# Patient Record
Sex: Male | Born: 1935 | Race: White | Hispanic: No | Marital: Married | Smoking: Never smoker
Health system: Southern US, Community
[De-identification: ages and names within clinical notes are randomized; demographics above are authoritative.]

## PROBLEM LIST (undated history)

## (undated) DIAGNOSIS — I2510 Atherosclerotic heart disease of native coronary artery without angina pectoris: Secondary | ICD-10-CM

## (undated) DIAGNOSIS — E7800 Pure hypercholesterolemia, unspecified: Secondary | ICD-10-CM

## (undated) DIAGNOSIS — I10 Essential (primary) hypertension: Secondary | ICD-10-CM

## (undated) DIAGNOSIS — I82409 Acute embolism and thrombosis of unspecified deep veins of unspecified lower extremity: Secondary | ICD-10-CM

## (undated) DIAGNOSIS — K219 Gastro-esophageal reflux disease without esophagitis: Secondary | ICD-10-CM

## (undated) HISTORY — PX: OTHER SURGICAL HISTORY: SHX169

## (undated) HISTORY — PX: APPENDECTOMY: SHX54

---

## 1998-04-09 ENCOUNTER — Ambulatory Visit (HOSPITAL_BASED_OUTPATIENT_CLINIC_OR_DEPARTMENT_OTHER): Admission: RE | Admit: 1998-04-09 | Discharge: 1998-04-09 | Payer: Self-pay | Admitting: Urology

## 1998-07-20 ENCOUNTER — Ambulatory Visit (HOSPITAL_COMMUNITY): Admission: RE | Admit: 1998-07-20 | Discharge: 1998-07-20 | Payer: Self-pay | Admitting: Gastroenterology

## 1999-11-21 ENCOUNTER — Ambulatory Visit: Admission: RE | Admit: 1999-11-21 | Discharge: 1999-11-21 | Payer: Self-pay | Admitting: *Deleted

## 1999-11-22 ENCOUNTER — Ambulatory Visit: Admission: RE | Admit: 1999-11-22 | Discharge: 1999-11-22 | Payer: Self-pay | Admitting: *Deleted

## 2000-01-18 ENCOUNTER — Ambulatory Visit (HOSPITAL_COMMUNITY): Admission: RE | Admit: 2000-01-18 | Discharge: 2000-01-18 | Payer: Self-pay | Admitting: Urology

## 2000-01-18 ENCOUNTER — Encounter: Payer: Self-pay | Admitting: Urology

## 2011-10-24 DIAGNOSIS — J309 Allergic rhinitis, unspecified: Secondary | ICD-10-CM | POA: Diagnosis not present

## 2011-10-24 DIAGNOSIS — I739 Peripheral vascular disease, unspecified: Secondary | ICD-10-CM | POA: Diagnosis not present

## 2011-10-24 DIAGNOSIS — L97209 Non-pressure chronic ulcer of unspecified calf with unspecified severity: Secondary | ICD-10-CM | POA: Diagnosis not present

## 2011-10-24 DIAGNOSIS — L97809 Non-pressure chronic ulcer of other part of unspecified lower leg with unspecified severity: Secondary | ICD-10-CM | POA: Diagnosis not present

## 2011-10-24 DIAGNOSIS — L259 Unspecified contact dermatitis, unspecified cause: Secondary | ICD-10-CM | POA: Diagnosis not present

## 2011-10-24 DIAGNOSIS — L918 Other hypertrophic disorders of the skin: Secondary | ICD-10-CM | POA: Diagnosis not present

## 2011-10-24 DIAGNOSIS — I10 Essential (primary) hypertension: Secondary | ICD-10-CM | POA: Diagnosis not present

## 2011-10-24 DIAGNOSIS — I872 Venous insufficiency (chronic) (peripheral): Secondary | ICD-10-CM | POA: Diagnosis not present

## 2011-10-28 DIAGNOSIS — I10 Essential (primary) hypertension: Secondary | ICD-10-CM | POA: Diagnosis not present

## 2011-10-28 DIAGNOSIS — I739 Peripheral vascular disease, unspecified: Secondary | ICD-10-CM | POA: Diagnosis not present

## 2011-10-28 DIAGNOSIS — I872 Venous insufficiency (chronic) (peripheral): Secondary | ICD-10-CM | POA: Diagnosis not present

## 2011-10-28 DIAGNOSIS — L97809 Non-pressure chronic ulcer of other part of unspecified lower leg with unspecified severity: Secondary | ICD-10-CM | POA: Diagnosis not present

## 2011-11-01 DIAGNOSIS — L918 Other hypertrophic disorders of the skin: Secondary | ICD-10-CM | POA: Diagnosis not present

## 2011-11-01 DIAGNOSIS — L97209 Non-pressure chronic ulcer of unspecified calf with unspecified severity: Secondary | ICD-10-CM | POA: Diagnosis not present

## 2011-11-01 DIAGNOSIS — I739 Peripheral vascular disease, unspecified: Secondary | ICD-10-CM | POA: Diagnosis not present

## 2011-11-01 DIAGNOSIS — I872 Venous insufficiency (chronic) (peripheral): Secondary | ICD-10-CM | POA: Diagnosis not present

## 2011-11-01 DIAGNOSIS — I10 Essential (primary) hypertension: Secondary | ICD-10-CM | POA: Diagnosis not present

## 2011-11-01 DIAGNOSIS — L97809 Non-pressure chronic ulcer of other part of unspecified lower leg with unspecified severity: Secondary | ICD-10-CM | POA: Diagnosis not present

## 2011-11-04 DIAGNOSIS — I872 Venous insufficiency (chronic) (peripheral): Secondary | ICD-10-CM | POA: Diagnosis not present

## 2011-11-04 DIAGNOSIS — I10 Essential (primary) hypertension: Secondary | ICD-10-CM | POA: Diagnosis not present

## 2011-11-04 DIAGNOSIS — L97809 Non-pressure chronic ulcer of other part of unspecified lower leg with unspecified severity: Secondary | ICD-10-CM | POA: Diagnosis not present

## 2011-11-04 DIAGNOSIS — I739 Peripheral vascular disease, unspecified: Secondary | ICD-10-CM | POA: Diagnosis not present

## 2011-11-08 DIAGNOSIS — I872 Venous insufficiency (chronic) (peripheral): Secondary | ICD-10-CM | POA: Diagnosis not present

## 2011-11-08 DIAGNOSIS — L97209 Non-pressure chronic ulcer of unspecified calf with unspecified severity: Secondary | ICD-10-CM | POA: Diagnosis not present

## 2011-11-08 DIAGNOSIS — I10 Essential (primary) hypertension: Secondary | ICD-10-CM | POA: Diagnosis not present

## 2011-11-08 DIAGNOSIS — L918 Other hypertrophic disorders of the skin: Secondary | ICD-10-CM | POA: Diagnosis not present

## 2011-11-08 DIAGNOSIS — I739 Peripheral vascular disease, unspecified: Secondary | ICD-10-CM | POA: Diagnosis not present

## 2011-11-08 DIAGNOSIS — L97809 Non-pressure chronic ulcer of other part of unspecified lower leg with unspecified severity: Secondary | ICD-10-CM | POA: Diagnosis not present

## 2011-11-11 DIAGNOSIS — I872 Venous insufficiency (chronic) (peripheral): Secondary | ICD-10-CM | POA: Diagnosis not present

## 2011-11-11 DIAGNOSIS — L97809 Non-pressure chronic ulcer of other part of unspecified lower leg with unspecified severity: Secondary | ICD-10-CM | POA: Diagnosis not present

## 2011-11-11 DIAGNOSIS — I10 Essential (primary) hypertension: Secondary | ICD-10-CM | POA: Diagnosis not present

## 2011-11-11 DIAGNOSIS — I739 Peripheral vascular disease, unspecified: Secondary | ICD-10-CM | POA: Diagnosis not present

## 2011-11-15 DIAGNOSIS — I872 Venous insufficiency (chronic) (peripheral): Secondary | ICD-10-CM | POA: Diagnosis not present

## 2011-11-15 DIAGNOSIS — I739 Peripheral vascular disease, unspecified: Secondary | ICD-10-CM | POA: Diagnosis not present

## 2011-11-15 DIAGNOSIS — L97809 Non-pressure chronic ulcer of other part of unspecified lower leg with unspecified severity: Secondary | ICD-10-CM | POA: Diagnosis not present

## 2011-11-15 DIAGNOSIS — I10 Essential (primary) hypertension: Secondary | ICD-10-CM | POA: Diagnosis not present

## 2011-11-18 DIAGNOSIS — I10 Essential (primary) hypertension: Secondary | ICD-10-CM | POA: Diagnosis not present

## 2011-11-18 DIAGNOSIS — I739 Peripheral vascular disease, unspecified: Secondary | ICD-10-CM | POA: Diagnosis not present

## 2011-11-18 DIAGNOSIS — I872 Venous insufficiency (chronic) (peripheral): Secondary | ICD-10-CM | POA: Diagnosis not present

## 2011-11-18 DIAGNOSIS — L97809 Non-pressure chronic ulcer of other part of unspecified lower leg with unspecified severity: Secondary | ICD-10-CM | POA: Diagnosis not present

## 2011-11-22 DIAGNOSIS — L97209 Non-pressure chronic ulcer of unspecified calf with unspecified severity: Secondary | ICD-10-CM | POA: Diagnosis not present

## 2011-11-22 DIAGNOSIS — L918 Other hypertrophic disorders of the skin: Secondary | ICD-10-CM | POA: Diagnosis not present

## 2011-11-22 DIAGNOSIS — I739 Peripheral vascular disease, unspecified: Secondary | ICD-10-CM | POA: Diagnosis not present

## 2011-11-22 DIAGNOSIS — I872 Venous insufficiency (chronic) (peripheral): Secondary | ICD-10-CM | POA: Diagnosis not present

## 2011-11-22 DIAGNOSIS — I10 Essential (primary) hypertension: Secondary | ICD-10-CM | POA: Diagnosis not present

## 2011-11-22 DIAGNOSIS — L97809 Non-pressure chronic ulcer of other part of unspecified lower leg with unspecified severity: Secondary | ICD-10-CM | POA: Diagnosis not present

## 2011-11-25 DIAGNOSIS — I739 Peripheral vascular disease, unspecified: Secondary | ICD-10-CM | POA: Diagnosis not present

## 2011-11-25 DIAGNOSIS — L97809 Non-pressure chronic ulcer of other part of unspecified lower leg with unspecified severity: Secondary | ICD-10-CM | POA: Diagnosis not present

## 2011-11-25 DIAGNOSIS — I10 Essential (primary) hypertension: Secondary | ICD-10-CM | POA: Diagnosis not present

## 2011-11-25 DIAGNOSIS — I872 Venous insufficiency (chronic) (peripheral): Secondary | ICD-10-CM | POA: Diagnosis not present

## 2011-11-29 DIAGNOSIS — I10 Essential (primary) hypertension: Secondary | ICD-10-CM | POA: Diagnosis not present

## 2011-11-29 DIAGNOSIS — M79609 Pain in unspecified limb: Secondary | ICD-10-CM | POA: Diagnosis not present

## 2011-11-29 DIAGNOSIS — I739 Peripheral vascular disease, unspecified: Secondary | ICD-10-CM | POA: Diagnosis not present

## 2011-11-29 DIAGNOSIS — B351 Tinea unguium: Secondary | ICD-10-CM | POA: Diagnosis not present

## 2011-11-29 DIAGNOSIS — I872 Venous insufficiency (chronic) (peripheral): Secondary | ICD-10-CM | POA: Diagnosis not present

## 2011-11-29 DIAGNOSIS — L97809 Non-pressure chronic ulcer of other part of unspecified lower leg with unspecified severity: Secondary | ICD-10-CM | POA: Diagnosis not present

## 2011-12-02 DIAGNOSIS — L97809 Non-pressure chronic ulcer of other part of unspecified lower leg with unspecified severity: Secondary | ICD-10-CM | POA: Diagnosis not present

## 2011-12-02 DIAGNOSIS — I872 Venous insufficiency (chronic) (peripheral): Secondary | ICD-10-CM | POA: Diagnosis not present

## 2011-12-02 DIAGNOSIS — I10 Essential (primary) hypertension: Secondary | ICD-10-CM | POA: Diagnosis not present

## 2011-12-02 DIAGNOSIS — I739 Peripheral vascular disease, unspecified: Secondary | ICD-10-CM | POA: Diagnosis not present

## 2011-12-06 DIAGNOSIS — I739 Peripheral vascular disease, unspecified: Secondary | ICD-10-CM | POA: Diagnosis not present

## 2011-12-06 DIAGNOSIS — I10 Essential (primary) hypertension: Secondary | ICD-10-CM | POA: Diagnosis not present

## 2011-12-06 DIAGNOSIS — L97809 Non-pressure chronic ulcer of other part of unspecified lower leg with unspecified severity: Secondary | ICD-10-CM | POA: Diagnosis not present

## 2011-12-06 DIAGNOSIS — L97209 Non-pressure chronic ulcer of unspecified calf with unspecified severity: Secondary | ICD-10-CM | POA: Diagnosis not present

## 2011-12-06 DIAGNOSIS — L918 Other hypertrophic disorders of the skin: Secondary | ICD-10-CM | POA: Diagnosis not present

## 2011-12-06 DIAGNOSIS — I872 Venous insufficiency (chronic) (peripheral): Secondary | ICD-10-CM | POA: Diagnosis not present

## 2011-12-15 DIAGNOSIS — I82409 Acute embolism and thrombosis of unspecified deep veins of unspecified lower extremity: Secondary | ICD-10-CM | POA: Diagnosis not present

## 2011-12-15 DIAGNOSIS — J309 Allergic rhinitis, unspecified: Secondary | ICD-10-CM | POA: Diagnosis not present

## 2012-01-12 DIAGNOSIS — J309 Allergic rhinitis, unspecified: Secondary | ICD-10-CM | POA: Diagnosis not present

## 2012-01-12 DIAGNOSIS — I82409 Acute embolism and thrombosis of unspecified deep veins of unspecified lower extremity: Secondary | ICD-10-CM | POA: Diagnosis not present

## 2012-02-08 DIAGNOSIS — E785 Hyperlipidemia, unspecified: Secondary | ICD-10-CM | POA: Diagnosis not present

## 2012-02-08 DIAGNOSIS — E559 Vitamin D deficiency, unspecified: Secondary | ICD-10-CM | POA: Diagnosis not present

## 2012-02-08 DIAGNOSIS — I82409 Acute embolism and thrombosis of unspecified deep veins of unspecified lower extremity: Secondary | ICD-10-CM | POA: Diagnosis not present

## 2012-02-08 DIAGNOSIS — I10 Essential (primary) hypertension: Secondary | ICD-10-CM | POA: Diagnosis not present

## 2012-02-08 DIAGNOSIS — N400 Enlarged prostate without lower urinary tract symptoms: Secondary | ICD-10-CM | POA: Diagnosis not present

## 2012-02-29 DIAGNOSIS — B351 Tinea unguium: Secondary | ICD-10-CM | POA: Diagnosis not present

## 2012-02-29 DIAGNOSIS — M79609 Pain in unspecified limb: Secondary | ICD-10-CM | POA: Diagnosis not present

## 2012-03-07 DIAGNOSIS — E785 Hyperlipidemia, unspecified: Secondary | ICD-10-CM | POA: Diagnosis not present

## 2012-03-07 DIAGNOSIS — E119 Type 2 diabetes mellitus without complications: Secondary | ICD-10-CM | POA: Diagnosis not present

## 2012-03-07 DIAGNOSIS — I82409 Acute embolism and thrombosis of unspecified deep veins of unspecified lower extremity: Secondary | ICD-10-CM | POA: Diagnosis not present

## 2012-03-07 DIAGNOSIS — R809 Proteinuria, unspecified: Secondary | ICD-10-CM | POA: Diagnosis not present

## 2012-03-07 DIAGNOSIS — D72829 Elevated white blood cell count, unspecified: Secondary | ICD-10-CM | POA: Diagnosis not present

## 2012-04-03 DIAGNOSIS — Z7901 Long term (current) use of anticoagulants: Secondary | ICD-10-CM | POA: Diagnosis not present

## 2012-04-03 DIAGNOSIS — I82409 Acute embolism and thrombosis of unspecified deep veins of unspecified lower extremity: Secondary | ICD-10-CM | POA: Diagnosis not present

## 2012-04-03 DIAGNOSIS — R635 Abnormal weight gain: Secondary | ICD-10-CM | POA: Diagnosis not present

## 2012-04-03 DIAGNOSIS — D7289 Other specified disorders of white blood cells: Secondary | ICD-10-CM | POA: Diagnosis not present

## 2012-05-04 DIAGNOSIS — Z7901 Long term (current) use of anticoagulants: Secondary | ICD-10-CM | POA: Diagnosis not present

## 2012-05-04 DIAGNOSIS — R319 Hematuria, unspecified: Secondary | ICD-10-CM | POA: Diagnosis not present

## 2012-05-04 DIAGNOSIS — I82409 Acute embolism and thrombosis of unspecified deep veins of unspecified lower extremity: Secondary | ICD-10-CM | POA: Diagnosis not present

## 2012-05-04 DIAGNOSIS — L570 Actinic keratosis: Secondary | ICD-10-CM | POA: Diagnosis not present

## 2012-05-30 DIAGNOSIS — B351 Tinea unguium: Secondary | ICD-10-CM | POA: Diagnosis not present

## 2012-05-30 DIAGNOSIS — M79609 Pain in unspecified limb: Secondary | ICD-10-CM | POA: Diagnosis not present

## 2012-06-09 DIAGNOSIS — Z23 Encounter for immunization: Secondary | ICD-10-CM | POA: Diagnosis not present

## 2012-06-12 DIAGNOSIS — I10 Essential (primary) hypertension: Secondary | ICD-10-CM | POA: Diagnosis not present

## 2012-06-12 DIAGNOSIS — E785 Hyperlipidemia, unspecified: Secondary | ICD-10-CM | POA: Diagnosis not present

## 2012-06-12 DIAGNOSIS — E119 Type 2 diabetes mellitus without complications: Secondary | ICD-10-CM | POA: Diagnosis not present

## 2012-06-14 DIAGNOSIS — Z79899 Other long term (current) drug therapy: Secondary | ICD-10-CM | POA: Diagnosis not present

## 2012-06-14 DIAGNOSIS — E782 Mixed hyperlipidemia: Secondary | ICD-10-CM | POA: Diagnosis not present

## 2012-06-14 DIAGNOSIS — I10 Essential (primary) hypertension: Secondary | ICD-10-CM | POA: Diagnosis not present

## 2012-06-14 DIAGNOSIS — I82409 Acute embolism and thrombosis of unspecified deep veins of unspecified lower extremity: Secondary | ICD-10-CM | POA: Diagnosis not present

## 2012-06-21 DIAGNOSIS — L570 Actinic keratosis: Secondary | ICD-10-CM | POA: Diagnosis not present

## 2012-06-21 DIAGNOSIS — L819 Disorder of pigmentation, unspecified: Secondary | ICD-10-CM | POA: Diagnosis not present

## 2012-06-21 DIAGNOSIS — L821 Other seborrheic keratosis: Secondary | ICD-10-CM | POA: Diagnosis not present

## 2012-06-21 DIAGNOSIS — D239 Other benign neoplasm of skin, unspecified: Secondary | ICD-10-CM | POA: Diagnosis not present

## 2012-07-09 DIAGNOSIS — Z86010 Personal history of colonic polyps: Secondary | ICD-10-CM | POA: Diagnosis not present

## 2012-07-09 DIAGNOSIS — K5900 Constipation, unspecified: Secondary | ICD-10-CM | POA: Diagnosis not present

## 2012-07-12 DIAGNOSIS — H04209 Unspecified epiphora, unspecified lacrimal gland: Secondary | ICD-10-CM | POA: Diagnosis not present

## 2012-07-12 DIAGNOSIS — H5200 Hypermetropia, unspecified eye: Secondary | ICD-10-CM | POA: Diagnosis not present

## 2012-07-12 DIAGNOSIS — H43819 Vitreous degeneration, unspecified eye: Secondary | ICD-10-CM | POA: Diagnosis not present

## 2012-07-19 DIAGNOSIS — I82409 Acute embolism and thrombosis of unspecified deep veins of unspecified lower extremity: Secondary | ICD-10-CM | POA: Diagnosis not present

## 2012-07-19 DIAGNOSIS — E1129 Type 2 diabetes mellitus with other diabetic kidney complication: Secondary | ICD-10-CM | POA: Diagnosis not present

## 2012-08-28 DIAGNOSIS — L97909 Non-pressure chronic ulcer of unspecified part of unspecified lower leg with unspecified severity: Secondary | ICD-10-CM | POA: Diagnosis not present

## 2012-08-28 DIAGNOSIS — R229 Localized swelling, mass and lump, unspecified: Secondary | ICD-10-CM | POA: Diagnosis not present

## 2012-08-28 DIAGNOSIS — L03119 Cellulitis of unspecified part of limb: Secondary | ICD-10-CM | POA: Diagnosis not present

## 2012-08-28 DIAGNOSIS — L02419 Cutaneous abscess of limb, unspecified: Secondary | ICD-10-CM | POA: Diagnosis not present

## 2012-08-28 DIAGNOSIS — I739 Peripheral vascular disease, unspecified: Secondary | ICD-10-CM | POA: Diagnosis not present

## 2012-08-30 ENCOUNTER — Encounter: Payer: Self-pay | Admitting: Cardiothoracic Surgery

## 2012-08-30 ENCOUNTER — Encounter: Payer: Self-pay | Admitting: Nurse Practitioner

## 2012-08-30 DIAGNOSIS — Z8673 Personal history of transient ischemic attack (TIA), and cerebral infarction without residual deficits: Secondary | ICD-10-CM | POA: Diagnosis not present

## 2012-08-30 DIAGNOSIS — I87339 Chronic venous hypertension (idiopathic) with ulcer and inflammation of unspecified lower extremity: Secondary | ICD-10-CM | POA: Diagnosis not present

## 2012-08-30 DIAGNOSIS — L97909 Non-pressure chronic ulcer of unspecified part of unspecified lower leg with unspecified severity: Secondary | ICD-10-CM | POA: Diagnosis not present

## 2012-08-30 DIAGNOSIS — S91009A Unspecified open wound, unspecified ankle, initial encounter: Secondary | ICD-10-CM | POA: Diagnosis not present

## 2012-08-30 DIAGNOSIS — Z8672 Personal history of thrombophlebitis: Secondary | ICD-10-CM | POA: Diagnosis not present

## 2012-08-30 DIAGNOSIS — L02419 Cutaneous abscess of limb, unspecified: Secondary | ICD-10-CM | POA: Diagnosis not present

## 2012-08-30 DIAGNOSIS — L03119 Cellulitis of unspecified part of limb: Secondary | ICD-10-CM | POA: Diagnosis not present

## 2012-08-30 DIAGNOSIS — E785 Hyperlipidemia, unspecified: Secondary | ICD-10-CM | POA: Diagnosis not present

## 2012-08-30 DIAGNOSIS — S81009A Unspecified open wound, unspecified knee, initial encounter: Secondary | ICD-10-CM | POA: Diagnosis not present

## 2012-08-30 DIAGNOSIS — I10 Essential (primary) hypertension: Secondary | ICD-10-CM | POA: Diagnosis not present

## 2012-09-06 DIAGNOSIS — L03119 Cellulitis of unspecified part of limb: Secondary | ICD-10-CM | POA: Diagnosis not present

## 2012-09-06 DIAGNOSIS — S81809A Unspecified open wound, unspecified lower leg, initial encounter: Secondary | ICD-10-CM | POA: Diagnosis not present

## 2012-09-06 DIAGNOSIS — I10 Essential (primary) hypertension: Secondary | ICD-10-CM | POA: Diagnosis not present

## 2012-09-06 DIAGNOSIS — Z8673 Personal history of transient ischemic attack (TIA), and cerebral infarction without residual deficits: Secondary | ICD-10-CM | POA: Diagnosis not present

## 2012-09-06 DIAGNOSIS — I87339 Chronic venous hypertension (idiopathic) with ulcer and inflammation of unspecified lower extremity: Secondary | ICD-10-CM | POA: Diagnosis not present

## 2012-09-06 DIAGNOSIS — E785 Hyperlipidemia, unspecified: Secondary | ICD-10-CM | POA: Diagnosis not present

## 2012-09-16 ENCOUNTER — Encounter: Payer: Self-pay | Admitting: Cardiothoracic Surgery

## 2012-09-16 ENCOUNTER — Encounter: Payer: Self-pay | Admitting: Nurse Practitioner

## 2012-09-16 DIAGNOSIS — S81009A Unspecified open wound, unspecified knee, initial encounter: Secondary | ICD-10-CM | POA: Diagnosis not present

## 2012-09-16 DIAGNOSIS — L97209 Non-pressure chronic ulcer of unspecified calf with unspecified severity: Secondary | ICD-10-CM | POA: Diagnosis not present

## 2012-09-16 DIAGNOSIS — I10 Essential (primary) hypertension: Secondary | ICD-10-CM | POA: Diagnosis not present

## 2012-09-16 DIAGNOSIS — R609 Edema, unspecified: Secondary | ICD-10-CM | POA: Diagnosis not present

## 2012-09-16 DIAGNOSIS — Z8672 Personal history of thrombophlebitis: Secondary | ICD-10-CM | POA: Diagnosis not present

## 2012-09-16 DIAGNOSIS — I87339 Chronic venous hypertension (idiopathic) with ulcer and inflammation of unspecified lower extremity: Secondary | ICD-10-CM | POA: Diagnosis not present

## 2012-09-16 DIAGNOSIS — E785 Hyperlipidemia, unspecified: Secondary | ICD-10-CM | POA: Diagnosis not present

## 2012-09-16 DIAGNOSIS — L02419 Cutaneous abscess of limb, unspecified: Secondary | ICD-10-CM | POA: Diagnosis not present

## 2012-09-20 DIAGNOSIS — S81809A Unspecified open wound, unspecified lower leg, initial encounter: Secondary | ICD-10-CM | POA: Diagnosis not present

## 2012-09-20 DIAGNOSIS — Z8672 Personal history of thrombophlebitis: Secondary | ICD-10-CM | POA: Diagnosis not present

## 2012-09-20 DIAGNOSIS — I87339 Chronic venous hypertension (idiopathic) with ulcer and inflammation of unspecified lower extremity: Secondary | ICD-10-CM | POA: Diagnosis not present

## 2012-09-20 DIAGNOSIS — I10 Essential (primary) hypertension: Secondary | ICD-10-CM | POA: Diagnosis not present

## 2012-09-20 DIAGNOSIS — R609 Edema, unspecified: Secondary | ICD-10-CM | POA: Diagnosis not present

## 2012-09-20 DIAGNOSIS — E785 Hyperlipidemia, unspecified: Secondary | ICD-10-CM | POA: Diagnosis not present

## 2012-09-20 DIAGNOSIS — S91009A Unspecified open wound, unspecified ankle, initial encounter: Secondary | ICD-10-CM | POA: Diagnosis not present

## 2012-09-20 DIAGNOSIS — L02419 Cutaneous abscess of limb, unspecified: Secondary | ICD-10-CM | POA: Diagnosis not present

## 2012-09-20 DIAGNOSIS — L97209 Non-pressure chronic ulcer of unspecified calf with unspecified severity: Secondary | ICD-10-CM | POA: Diagnosis not present

## 2012-09-26 DIAGNOSIS — I10 Essential (primary) hypertension: Secondary | ICD-10-CM | POA: Diagnosis not present

## 2012-09-26 DIAGNOSIS — Z1331 Encounter for screening for depression: Secondary | ICD-10-CM | POA: Diagnosis not present

## 2012-09-26 DIAGNOSIS — I82409 Acute embolism and thrombosis of unspecified deep veins of unspecified lower extremity: Secondary | ICD-10-CM | POA: Diagnosis not present

## 2012-09-26 DIAGNOSIS — D126 Benign neoplasm of colon, unspecified: Secondary | ICD-10-CM | POA: Diagnosis not present

## 2012-09-26 DIAGNOSIS — N400 Enlarged prostate without lower urinary tract symptoms: Secondary | ICD-10-CM | POA: Diagnosis not present

## 2012-09-26 DIAGNOSIS — Z7901 Long term (current) use of anticoagulants: Secondary | ICD-10-CM | POA: Diagnosis not present

## 2012-09-26 DIAGNOSIS — H04209 Unspecified epiphora, unspecified lacrimal gland: Secondary | ICD-10-CM | POA: Diagnosis not present

## 2012-09-26 DIAGNOSIS — E785 Hyperlipidemia, unspecified: Secondary | ICD-10-CM | POA: Diagnosis not present

## 2012-09-27 DIAGNOSIS — E785 Hyperlipidemia, unspecified: Secondary | ICD-10-CM | POA: Diagnosis not present

## 2012-09-27 DIAGNOSIS — S91009A Unspecified open wound, unspecified ankle, initial encounter: Secondary | ICD-10-CM | POA: Diagnosis not present

## 2012-09-27 DIAGNOSIS — I10 Essential (primary) hypertension: Secondary | ICD-10-CM | POA: Diagnosis not present

## 2012-09-27 DIAGNOSIS — R609 Edema, unspecified: Secondary | ICD-10-CM | POA: Diagnosis not present

## 2012-09-27 DIAGNOSIS — Z8672 Personal history of thrombophlebitis: Secondary | ICD-10-CM | POA: Diagnosis not present

## 2012-09-27 DIAGNOSIS — L02419 Cutaneous abscess of limb, unspecified: Secondary | ICD-10-CM | POA: Diagnosis not present

## 2012-09-27 DIAGNOSIS — L97209 Non-pressure chronic ulcer of unspecified calf with unspecified severity: Secondary | ICD-10-CM | POA: Diagnosis not present

## 2012-09-27 DIAGNOSIS — I87339 Chronic venous hypertension (idiopathic) with ulcer and inflammation of unspecified lower extremity: Secondary | ICD-10-CM | POA: Diagnosis not present

## 2012-10-03 DIAGNOSIS — Z7901 Long term (current) use of anticoagulants: Secondary | ICD-10-CM | POA: Diagnosis not present

## 2012-10-04 DIAGNOSIS — R609 Edema, unspecified: Secondary | ICD-10-CM | POA: Diagnosis not present

## 2012-10-04 DIAGNOSIS — L02419 Cutaneous abscess of limb, unspecified: Secondary | ICD-10-CM | POA: Diagnosis not present

## 2012-10-04 DIAGNOSIS — Z8672 Personal history of thrombophlebitis: Secondary | ICD-10-CM | POA: Diagnosis not present

## 2012-10-04 DIAGNOSIS — S81009A Unspecified open wound, unspecified knee, initial encounter: Secondary | ICD-10-CM | POA: Diagnosis not present

## 2012-10-04 DIAGNOSIS — L97209 Non-pressure chronic ulcer of unspecified calf with unspecified severity: Secondary | ICD-10-CM | POA: Diagnosis not present

## 2012-10-04 DIAGNOSIS — L97909 Non-pressure chronic ulcer of unspecified part of unspecified lower leg with unspecified severity: Secondary | ICD-10-CM | POA: Diagnosis not present

## 2012-10-04 DIAGNOSIS — I10 Essential (primary) hypertension: Secondary | ICD-10-CM | POA: Diagnosis not present

## 2012-10-04 DIAGNOSIS — E785 Hyperlipidemia, unspecified: Secondary | ICD-10-CM | POA: Diagnosis not present

## 2012-10-08 DIAGNOSIS — H04229 Epiphora due to insufficient drainage, unspecified lacrimal gland: Secondary | ICD-10-CM | POA: Diagnosis not present

## 2012-10-11 ENCOUNTER — Observation Stay (HOSPITAL_COMMUNITY)
Admission: EM | Admit: 2012-10-11 | Discharge: 2012-10-12 | Disposition: A | Discharge status: Home / Self Care | Payer: Medicare Other | Attending: Surgery | Admitting: Surgery

## 2012-10-11 ENCOUNTER — Emergency Department (HOSPITAL_COMMUNITY): Payer: Medicare Other

## 2012-10-11 ENCOUNTER — Encounter (HOSPITAL_COMMUNITY): Payer: Self-pay | Admitting: *Deleted

## 2012-10-11 DIAGNOSIS — T148XXA Other injury of unspecified body region, initial encounter: Secondary | ICD-10-CM

## 2012-10-11 DIAGNOSIS — R609 Edema, unspecified: Secondary | ICD-10-CM | POA: Diagnosis not present

## 2012-10-11 DIAGNOSIS — Z86718 Personal history of other venous thrombosis and embolism: Secondary | ICD-10-CM | POA: Insufficient documentation

## 2012-10-11 DIAGNOSIS — K219 Gastro-esophageal reflux disease without esophagitis: Secondary | ICD-10-CM | POA: Insufficient documentation

## 2012-10-11 DIAGNOSIS — I2510 Atherosclerotic heart disease of native coronary artery without angina pectoris: Secondary | ICD-10-CM | POA: Insufficient documentation

## 2012-10-11 DIAGNOSIS — S81809A Unspecified open wound, unspecified lower leg, initial encounter: Secondary | ICD-10-CM | POA: Diagnosis not present

## 2012-10-11 DIAGNOSIS — E785 Hyperlipidemia, unspecified: Secondary | ICD-10-CM | POA: Insufficient documentation

## 2012-10-11 DIAGNOSIS — R791 Abnormal coagulation profile: Secondary | ICD-10-CM

## 2012-10-11 DIAGNOSIS — Z7902 Long term (current) use of antithrombotics/antiplatelets: Secondary | ICD-10-CM | POA: Insufficient documentation

## 2012-10-11 DIAGNOSIS — S065X0A Traumatic subdural hemorrhage without loss of consciousness, initial encounter: Principal | ICD-10-CM | POA: Insufficient documentation

## 2012-10-11 DIAGNOSIS — Z7901 Long term (current) use of anticoagulants: Secondary | ICD-10-CM | POA: Diagnosis not present

## 2012-10-11 DIAGNOSIS — M79609 Pain in unspecified limb: Secondary | ICD-10-CM | POA: Diagnosis not present

## 2012-10-11 DIAGNOSIS — S8990XA Unspecified injury of unspecified lower leg, initial encounter: Secondary | ICD-10-CM | POA: Diagnosis not present

## 2012-10-11 DIAGNOSIS — S065X9A Traumatic subdural hemorrhage with loss of consciousness of unspecified duration, initial encounter: Secondary | ICD-10-CM | POA: Diagnosis present

## 2012-10-11 DIAGNOSIS — S065XAA Traumatic subdural hemorrhage with loss of consciousness status unknown, initial encounter: Secondary | ICD-10-CM

## 2012-10-11 DIAGNOSIS — I10 Essential (primary) hypertension: Secondary | ICD-10-CM | POA: Diagnosis not present

## 2012-10-11 DIAGNOSIS — S81009A Unspecified open wound, unspecified knee, initial encounter: Secondary | ICD-10-CM | POA: Diagnosis not present

## 2012-10-11 DIAGNOSIS — I4891 Unspecified atrial fibrillation: Secondary | ICD-10-CM | POA: Diagnosis not present

## 2012-10-11 DIAGNOSIS — L97909 Non-pressure chronic ulcer of unspecified part of unspecified lower leg with unspecified severity: Secondary | ICD-10-CM | POA: Diagnosis not present

## 2012-10-11 DIAGNOSIS — L97209 Non-pressure chronic ulcer of unspecified calf with unspecified severity: Secondary | ICD-10-CM | POA: Diagnosis not present

## 2012-10-11 DIAGNOSIS — S99919A Unspecified injury of unspecified ankle, initial encounter: Secondary | ICD-10-CM | POA: Diagnosis not present

## 2012-10-11 DIAGNOSIS — Z79899 Other long term (current) drug therapy: Secondary | ICD-10-CM | POA: Insufficient documentation

## 2012-10-11 DIAGNOSIS — L03119 Cellulitis of unspecified part of limb: Secondary | ICD-10-CM | POA: Diagnosis not present

## 2012-10-11 DIAGNOSIS — Z8672 Personal history of thrombophlebitis: Secondary | ICD-10-CM | POA: Diagnosis not present

## 2012-10-11 DIAGNOSIS — IMO0002 Reserved for concepts with insufficient information to code with codable children: Secondary | ICD-10-CM

## 2012-10-11 DIAGNOSIS — S8010XA Contusion of unspecified lower leg, initial encounter: Secondary | ICD-10-CM | POA: Diagnosis not present

## 2012-10-11 DIAGNOSIS — Y92410 Unspecified street and highway as the place of occurrence of the external cause: Secondary | ICD-10-CM | POA: Insufficient documentation

## 2012-10-11 HISTORY — DX: Atherosclerotic heart disease of native coronary artery without angina pectoris: I25.10

## 2012-10-11 HISTORY — DX: Essential (primary) hypertension: I10

## 2012-10-11 HISTORY — DX: Acute embolism and thrombosis of unspecified deep veins of unspecified lower extremity: I82.409

## 2012-10-11 HISTORY — DX: Pure hypercholesterolemia, unspecified: E78.00

## 2012-10-11 HISTORY — DX: Gastro-esophageal reflux disease without esophagitis: K21.9

## 2012-10-11 LAB — CBC WITH DIFFERENTIAL/PLATELET
Basophils Absolute: 0.1 10*3/uL (ref 0.0–0.1)
Basophils Relative: 1 % (ref 0–1)
Eosinophils Absolute: 0.2 10*3/uL (ref 0.0–0.7)
Eosinophils Relative: 2 % (ref 0–5)
HCT: 35.7 % — ABNORMAL LOW (ref 39.0–52.0)
Hemoglobin: 11.8 g/dL — ABNORMAL LOW (ref 13.0–17.0)
Lymphocytes Relative: 59 % — ABNORMAL HIGH (ref 12–46)
Lymphs Abs: 7.1 10*3/uL — ABNORMAL HIGH (ref 0.7–4.0)
MCH: 28.1 pg (ref 26.0–34.0)
MCHC: 33.1 g/dL (ref 30.0–36.0)
MCV: 85 fL (ref 78.0–100.0)
Monocytes Absolute: 0.8 10*3/uL (ref 0.1–1.0)
Monocytes Relative: 7 % (ref 3–12)
Neutro Abs: 3.7 10*3/uL (ref 1.7–7.7)
Neutrophils Relative %: 31 % — ABNORMAL LOW (ref 43–77)
Platelets: 271 10*3/uL (ref 150–400)
RBC: 4.2 MIL/uL — ABNORMAL LOW (ref 4.22–5.81)
RDW: 14.1 % (ref 11.5–15.5)
WBC: 11.9 10*3/uL — ABNORMAL HIGH (ref 4.0–10.5)

## 2012-10-11 LAB — MRSA PCR SCREENING: MRSA by PCR: NEGATIVE

## 2012-10-11 MED ORDER — MORPHINE SULFATE 2 MG/ML IJ SOLN
1.0000 mg | INTRAMUSCULAR | Status: DC | PRN
Start: 2012-10-11 — End: 2012-10-12

## 2012-10-11 MED ORDER — ONDANSETRON HCL 4 MG/2ML IJ SOLN: 4.0000 mg | Freq: Four times a day (QID) | INTRAMUSCULAR | Status: DC | PRN

## 2012-10-11 MED ORDER — SODIUM CHLORIDE 0.9 % IV SOLN
INTRAVENOUS | Status: DC
  Administered 2012-10-11: 21:00:00 via INTRAVENOUS

## 2012-10-11 MED ORDER — SULFAMETHOXAZOLE-TMP DS 800-160 MG PO TABS
1.0000 | ORAL_TABLET | Freq: Two times a day (BID) | ORAL | Status: DC
  Administered 2012-10-11 – 2012-10-12 (×2): 1 via ORAL
  Filled 2012-10-11 (×3): qty 1

## 2012-10-11 MED ORDER — SIMVASTATIN 40 MG PO TABS
40.0000 mg | ORAL_TABLET | Freq: Every day | ORAL | Status: DC
  Administered 2012-10-11: 40 mg via ORAL
  Filled 2012-10-11 (×2): qty 1

## 2012-10-11 MED ORDER — ADULT MULTIVITAMIN W/MINERALS CH
1.0000 | ORAL_TABLET | Freq: Every day | ORAL | Status: DC
  Administered 2012-10-12: 1 via ORAL
  Filled 2012-10-11: qty 1

## 2012-10-11 MED ORDER — PHYTONADIONE 5 MG PO TABS
5.0000 mg | ORAL_TABLET | Freq: Once | ORAL | Status: AC
  Administered 2012-10-11: 5 mg via ORAL
  Filled 2012-10-11: qty 1

## 2012-10-11 MED ORDER — HYDROCODONE-ACETAMINOPHEN 5-325 MG PO TABS
1.0000 | ORAL_TABLET | ORAL | Status: DC | PRN
  Administered 2012-10-11: 1 via ORAL
  Filled 2012-10-11: qty 1

## 2012-10-11 MED ORDER — TAMSULOSIN HCL 0.4 MG PO CAPS
0.4000 mg | ORAL_CAPSULE | Freq: Every day | ORAL | Status: DC
  Administered 2012-10-11 – 2012-10-12 (×2): 0.4 mg via ORAL
  Filled 2012-10-11 (×2): qty 1

## 2012-10-11 MED ORDER — ACETAMINOPHEN 325 MG PO TABS: 650.0000 mg | ORAL_TABLET | ORAL | Status: DC | PRN

## 2012-10-11 MED ORDER — LISINOPRIL 2.5 MG PO TABS
2.5000 mg | ORAL_TABLET | Freq: Every day | ORAL | Status: DC
  Administered 2012-10-11 – 2012-10-12 (×2): 2.5 mg via ORAL
  Filled 2012-10-11 (×2): qty 1

## 2012-10-11 MED ORDER — ONDANSETRON HCL 4 MG PO TABS: 4.0000 mg | ORAL_TABLET | Freq: Four times a day (QID) | ORAL | Status: DC | PRN

## 2012-10-11 MED ORDER — PANTOPRAZOLE SODIUM 40 MG PO TBEC
40.0000 mg | DELAYED_RELEASE_TABLET | Freq: Every day | ORAL | Status: DC
  Administered 2012-10-12: 40 mg via ORAL
  Filled 2012-10-11: qty 1
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
## 2012-10-12 ENCOUNTER — Encounter (HOSPITAL_COMMUNITY): Payer: Self-pay | Admitting: Radiology

## 2012-10-12 ENCOUNTER — Observation Stay (HOSPITAL_COMMUNITY): Payer: Medicare Other

## 2012-10-12 DIAGNOSIS — S065XAA Traumatic subdural hemorrhage with loss of consciousness status unknown, initial encounter: Secondary | ICD-10-CM | POA: Diagnosis present

## 2012-10-12 DIAGNOSIS — E785 Hyperlipidemia, unspecified: Secondary | ICD-10-CM | POA: Insufficient documentation

## 2012-10-12 DIAGNOSIS — S065X9A Traumatic subdural hemorrhage with loss of consciousness of unspecified duration, initial encounter: Secondary | ICD-10-CM | POA: Diagnosis present

## 2012-10-12 DIAGNOSIS — I10 Essential (primary) hypertension: Secondary | ICD-10-CM | POA: Insufficient documentation

## 2012-10-12 DIAGNOSIS — I2510 Atherosclerotic heart disease of native coronary artery without angina pectoris: Secondary | ICD-10-CM | POA: Insufficient documentation

## 2012-10-12 DIAGNOSIS — IMO0002 Reserved for concepts with insufficient information to code with codable children: Secondary | ICD-10-CM | POA: Diagnosis not present

## 2012-10-12 DIAGNOSIS — I82409 Acute embolism and thrombosis of unspecified deep veins of unspecified lower extremity: Secondary | ICD-10-CM | POA: Insufficient documentation

## 2012-10-12 DIAGNOSIS — K219 Gastro-esophageal reflux disease without esophagitis: Secondary | ICD-10-CM | POA: Insufficient documentation

## 2012-10-12 LAB — BASIC METABOLIC PANEL WITH GFR
BUN: 21 mg/dL (ref 6–23)
CO2: 18 meq/L — ABNORMAL LOW (ref 19–32)
Calcium: 9.1 mg/dL (ref 8.4–10.5)
Chloride: 105 meq/L (ref 96–112)
Creatinine, Ser: 1.23 mg/dL (ref 0.50–1.35)
GFR calc Af Amer: 61 mL/min — ABNORMAL LOW
GFR calc non Af Amer: 52 mL/min — ABNORMAL LOW
Glucose, Bld: 117 mg/dL — ABNORMAL HIGH (ref 70–99)
Potassium: 4.1 meq/L (ref 3.5–5.1)
Sodium: 134 meq/L — ABNORMAL LOW (ref 135–145)

## 2012-10-12 LAB — CBC
HCT: 31.9 % — ABNORMAL LOW (ref 39.0–52.0)
Hemoglobin: 10.5 g/dL — ABNORMAL LOW (ref 13.0–17.0)
MCV: 85.5 fL (ref 78.0–100.0)
RDW: 14.2 % (ref 11.5–15.5)
WBC: 9.3 10*3/uL (ref 4.0–10.5)

## 2012-10-12 LAB — PROTIME-INR
INR: 3.42 — ABNORMAL HIGH (ref 0.00–1.49)
Prothrombin Time: 32.6 s — ABNORMAL HIGH (ref 11.6–15.2)
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
## 2012-10-12 NOTE — Progress Notes (Signed)
UR completed 
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
## 2012-10-12 NOTE — Progress Notes (Signed)
Agree with PA-Jeffery's note.  Home today if NSR agrees.

## 2012-10-17 ENCOUNTER — Encounter: Payer: Self-pay | Admitting: Cardiothoracic Surgery

## 2012-10-17 ENCOUNTER — Encounter: Payer: Self-pay | Admitting: Nurse Practitioner

## 2012-10-18 DIAGNOSIS — Z8673 Personal history of transient ischemic attack (TIA), and cerebral infarction without residual deficits: Secondary | ICD-10-CM | POA: Diagnosis not present

## 2012-10-18 DIAGNOSIS — L97909 Non-pressure chronic ulcer of unspecified part of unspecified lower leg with unspecified severity: Secondary | ICD-10-CM | POA: Diagnosis not present

## 2012-10-18 DIAGNOSIS — L97509 Non-pressure chronic ulcer of other part of unspecified foot with unspecified severity: Secondary | ICD-10-CM | POA: Diagnosis not present

## 2012-10-18 DIAGNOSIS — L02419 Cutaneous abscess of limb, unspecified: Secondary | ICD-10-CM | POA: Diagnosis not present

## 2012-10-18 DIAGNOSIS — E785 Hyperlipidemia, unspecified: Secondary | ICD-10-CM | POA: Diagnosis not present

## 2012-10-18 DIAGNOSIS — I87339 Chronic venous hypertension (idiopathic) with ulcer and inflammation of unspecified lower extremity: Secondary | ICD-10-CM | POA: Diagnosis not present

## 2012-10-19 ENCOUNTER — Other Ambulatory Visit: Payer: Self-pay | Admitting: Neurosurgery

## 2012-10-19 DIAGNOSIS — S065X9A Traumatic subdural hemorrhage with loss of consciousness of unspecified duration, initial encounter: Secondary | ICD-10-CM

## 2012-10-23 ENCOUNTER — Ambulatory Visit
Admission: RE | Admit: 2012-10-23 | Discharge: 2012-10-23 | Disposition: A | Discharge status: Home / Self Care | Payer: Medicare Other | Source: Ambulatory Visit | Attending: Neurosurgery | Admitting: Neurosurgery

## 2012-10-23 DIAGNOSIS — S066X9A Traumatic subarachnoid hemorrhage with loss of consciousness of unspecified duration, initial encounter: Secondary | ICD-10-CM | POA: Diagnosis not present

## 2012-10-23 DIAGNOSIS — I619 Nontraumatic intracerebral hemorrhage, unspecified: Secondary | ICD-10-CM | POA: Diagnosis not present

## 2012-10-23 DIAGNOSIS — S065X9A Traumatic subdural hemorrhage with loss of consciousness of unspecified duration, initial encounter: Secondary | ICD-10-CM

## 2012-10-23 DIAGNOSIS — Z86010 Personal history of colonic polyps: Secondary | ICD-10-CM | POA: Diagnosis not present

## 2012-10-25 DIAGNOSIS — Z8673 Personal history of transient ischemic attack (TIA), and cerebral infarction without residual deficits: Secondary | ICD-10-CM | POA: Diagnosis not present

## 2012-10-25 DIAGNOSIS — E785 Hyperlipidemia, unspecified: Secondary | ICD-10-CM | POA: Diagnosis not present

## 2012-10-25 DIAGNOSIS — L97509 Non-pressure chronic ulcer of other part of unspecified foot with unspecified severity: Secondary | ICD-10-CM | POA: Diagnosis not present

## 2012-10-25 DIAGNOSIS — I87339 Chronic venous hypertension (idiopathic) with ulcer and inflammation of unspecified lower extremity: Secondary | ICD-10-CM | POA: Diagnosis not present

## 2012-10-25 DIAGNOSIS — L02419 Cutaneous abscess of limb, unspecified: Secondary | ICD-10-CM | POA: Diagnosis not present

## 2012-10-26 DIAGNOSIS — Z7901 Long term (current) use of anticoagulants: Secondary | ICD-10-CM | POA: Diagnosis not present

## 2012-11-01 DIAGNOSIS — L97909 Non-pressure chronic ulcer of unspecified part of unspecified lower leg with unspecified severity: Secondary | ICD-10-CM | POA: Diagnosis not present

## 2012-11-01 DIAGNOSIS — L02419 Cutaneous abscess of limb, unspecified: Secondary | ICD-10-CM | POA: Diagnosis not present

## 2012-11-01 DIAGNOSIS — Z8673 Personal history of transient ischemic attack (TIA), and cerebral infarction without residual deficits: Secondary | ICD-10-CM | POA: Diagnosis not present

## 2012-11-01 DIAGNOSIS — I87339 Chronic venous hypertension (idiopathic) with ulcer and inflammation of unspecified lower extremity: Secondary | ICD-10-CM | POA: Diagnosis not present

## 2012-11-01 DIAGNOSIS — E785 Hyperlipidemia, unspecified: Secondary | ICD-10-CM | POA: Diagnosis not present

## 2012-11-01 DIAGNOSIS — L97509 Non-pressure chronic ulcer of other part of unspecified foot with unspecified severity: Secondary | ICD-10-CM | POA: Diagnosis not present

## 2012-11-08 DIAGNOSIS — L03119 Cellulitis of unspecified part of limb: Secondary | ICD-10-CM | POA: Diagnosis not present

## 2012-11-08 DIAGNOSIS — E785 Hyperlipidemia, unspecified: Secondary | ICD-10-CM | POA: Diagnosis not present

## 2012-11-08 DIAGNOSIS — L02419 Cutaneous abscess of limb, unspecified: Secondary | ICD-10-CM | POA: Diagnosis not present

## 2012-11-08 DIAGNOSIS — Z8673 Personal history of transient ischemic attack (TIA), and cerebral infarction without residual deficits: Secondary | ICD-10-CM | POA: Diagnosis not present

## 2012-11-08 DIAGNOSIS — L97509 Non-pressure chronic ulcer of other part of unspecified foot with unspecified severity: Secondary | ICD-10-CM | POA: Diagnosis not present

## 2012-11-08 DIAGNOSIS — I87339 Chronic venous hypertension (idiopathic) with ulcer and inflammation of unspecified lower extremity: Secondary | ICD-10-CM | POA: Diagnosis not present

## 2012-11-08 DIAGNOSIS — L97909 Non-pressure chronic ulcer of unspecified part of unspecified lower leg with unspecified severity: Secondary | ICD-10-CM | POA: Diagnosis not present

## 2012-11-15 DIAGNOSIS — E785 Hyperlipidemia, unspecified: Secondary | ICD-10-CM | POA: Diagnosis not present

## 2012-11-15 DIAGNOSIS — L97909 Non-pressure chronic ulcer of unspecified part of unspecified lower leg with unspecified severity: Secondary | ICD-10-CM | POA: Diagnosis not present

## 2012-11-15 DIAGNOSIS — L02419 Cutaneous abscess of limb, unspecified: Secondary | ICD-10-CM | POA: Diagnosis not present

## 2012-11-15 DIAGNOSIS — L03119 Cellulitis of unspecified part of limb: Secondary | ICD-10-CM | POA: Diagnosis not present

## 2012-11-15 DIAGNOSIS — L97509 Non-pressure chronic ulcer of other part of unspecified foot with unspecified severity: Secondary | ICD-10-CM | POA: Diagnosis not present

## 2012-11-15 DIAGNOSIS — I87339 Chronic venous hypertension (idiopathic) with ulcer and inflammation of unspecified lower extremity: Secondary | ICD-10-CM | POA: Diagnosis not present

## 2012-11-15 DIAGNOSIS — Z8673 Personal history of transient ischemic attack (TIA), and cerebral infarction without residual deficits: Secondary | ICD-10-CM | POA: Diagnosis not present

## 2012-11-17 ENCOUNTER — Encounter: Payer: Self-pay | Admitting: Nurse Practitioner

## 2012-11-17 ENCOUNTER — Encounter: Payer: Self-pay | Admitting: Cardiothoracic Surgery

## 2012-11-17 DIAGNOSIS — Z8672 Personal history of thrombophlebitis: Secondary | ICD-10-CM | POA: Diagnosis not present

## 2012-11-17 DIAGNOSIS — R609 Edema, unspecified: Secondary | ICD-10-CM | POA: Diagnosis not present

## 2012-11-17 DIAGNOSIS — L97509 Non-pressure chronic ulcer of other part of unspecified foot with unspecified severity: Secondary | ICD-10-CM | POA: Diagnosis not present

## 2012-11-17 DIAGNOSIS — E785 Hyperlipidemia, unspecified: Secondary | ICD-10-CM | POA: Diagnosis not present

## 2012-11-17 DIAGNOSIS — I87339 Chronic venous hypertension (idiopathic) with ulcer and inflammation of unspecified lower extremity: Secondary | ICD-10-CM | POA: Diagnosis not present

## 2012-11-17 DIAGNOSIS — L02419 Cutaneous abscess of limb, unspecified: Secondary | ICD-10-CM | POA: Diagnosis not present

## 2012-11-19 DIAGNOSIS — Z7901 Long term (current) use of anticoagulants: Secondary | ICD-10-CM | POA: Diagnosis not present

## 2012-11-22 DIAGNOSIS — I87339 Chronic venous hypertension (idiopathic) with ulcer and inflammation of unspecified lower extremity: Secondary | ICD-10-CM | POA: Diagnosis not present

## 2012-11-22 DIAGNOSIS — L97509 Non-pressure chronic ulcer of other part of unspecified foot with unspecified severity: Secondary | ICD-10-CM | POA: Diagnosis not present

## 2012-11-22 DIAGNOSIS — L02419 Cutaneous abscess of limb, unspecified: Secondary | ICD-10-CM | POA: Diagnosis not present

## 2012-11-22 DIAGNOSIS — E785 Hyperlipidemia, unspecified: Secondary | ICD-10-CM | POA: Diagnosis not present

## 2012-11-22 DIAGNOSIS — Z8672 Personal history of thrombophlebitis: Secondary | ICD-10-CM | POA: Diagnosis not present

## 2012-11-22 DIAGNOSIS — R609 Edema, unspecified: Secondary | ICD-10-CM | POA: Diagnosis not present

## 2012-11-26 DIAGNOSIS — E785 Hyperlipidemia, unspecified: Secondary | ICD-10-CM | POA: Diagnosis not present

## 2012-11-26 DIAGNOSIS — D72829 Elevated white blood cell count, unspecified: Secondary | ICD-10-CM | POA: Diagnosis not present

## 2012-11-26 DIAGNOSIS — Z7901 Long term (current) use of anticoagulants: Secondary | ICD-10-CM | POA: Diagnosis not present

## 2012-11-26 DIAGNOSIS — R7301 Impaired fasting glucose: Secondary | ICD-10-CM | POA: Diagnosis not present

## 2012-12-01 DIAGNOSIS — E785 Hyperlipidemia, unspecified: Secondary | ICD-10-CM | POA: Diagnosis not present

## 2012-12-01 DIAGNOSIS — L97509 Non-pressure chronic ulcer of other part of unspecified foot with unspecified severity: Secondary | ICD-10-CM | POA: Diagnosis not present

## 2012-12-01 DIAGNOSIS — Z8672 Personal history of thrombophlebitis: Secondary | ICD-10-CM | POA: Diagnosis not present

## 2012-12-01 DIAGNOSIS — L97909 Non-pressure chronic ulcer of unspecified part of unspecified lower leg with unspecified severity: Secondary | ICD-10-CM | POA: Diagnosis not present

## 2012-12-01 DIAGNOSIS — R609 Edema, unspecified: Secondary | ICD-10-CM | POA: Diagnosis not present

## 2012-12-01 DIAGNOSIS — I87339 Chronic venous hypertension (idiopathic) with ulcer and inflammation of unspecified lower extremity: Secondary | ICD-10-CM | POA: Diagnosis not present

## 2012-12-01 DIAGNOSIS — L02419 Cutaneous abscess of limb, unspecified: Secondary | ICD-10-CM | POA: Diagnosis not present

## 2012-12-03 DIAGNOSIS — R609 Edema, unspecified: Secondary | ICD-10-CM | POA: Diagnosis not present

## 2012-12-03 DIAGNOSIS — L97509 Non-pressure chronic ulcer of other part of unspecified foot with unspecified severity: Secondary | ICD-10-CM | POA: Diagnosis not present

## 2012-12-03 DIAGNOSIS — L97909 Non-pressure chronic ulcer of unspecified part of unspecified lower leg with unspecified severity: Secondary | ICD-10-CM | POA: Diagnosis not present

## 2012-12-03 DIAGNOSIS — L03119 Cellulitis of unspecified part of limb: Secondary | ICD-10-CM | POA: Diagnosis not present

## 2012-12-03 DIAGNOSIS — E785 Hyperlipidemia, unspecified: Secondary | ICD-10-CM | POA: Diagnosis not present

## 2012-12-03 DIAGNOSIS — Z7901 Long term (current) use of anticoagulants: Secondary | ICD-10-CM | POA: Diagnosis not present

## 2012-12-03 DIAGNOSIS — Z8672 Personal history of thrombophlebitis: Secondary | ICD-10-CM | POA: Diagnosis not present

## 2012-12-05 LAB — WOUND CULTURE

## 2012-12-06 DIAGNOSIS — Z8672 Personal history of thrombophlebitis: Secondary | ICD-10-CM | POA: Diagnosis not present

## 2012-12-06 DIAGNOSIS — L97909 Non-pressure chronic ulcer of unspecified part of unspecified lower leg with unspecified severity: Secondary | ICD-10-CM | POA: Diagnosis not present

## 2012-12-06 DIAGNOSIS — E785 Hyperlipidemia, unspecified: Secondary | ICD-10-CM | POA: Diagnosis not present

## 2012-12-06 DIAGNOSIS — L02419 Cutaneous abscess of limb, unspecified: Secondary | ICD-10-CM | POA: Diagnosis not present

## 2012-12-10 DIAGNOSIS — Z7901 Long term (current) use of anticoagulants: Secondary | ICD-10-CM | POA: Diagnosis not present

## 2012-12-13 DIAGNOSIS — E785 Hyperlipidemia, unspecified: Secondary | ICD-10-CM | POA: Diagnosis not present

## 2012-12-13 DIAGNOSIS — R609 Edema, unspecified: Secondary | ICD-10-CM | POA: Diagnosis not present

## 2012-12-13 DIAGNOSIS — I87339 Chronic venous hypertension (idiopathic) with ulcer and inflammation of unspecified lower extremity: Secondary | ICD-10-CM | POA: Diagnosis not present

## 2012-12-13 DIAGNOSIS — L02419 Cutaneous abscess of limb, unspecified: Secondary | ICD-10-CM | POA: Diagnosis not present

## 2012-12-13 DIAGNOSIS — Z8672 Personal history of thrombophlebitis: Secondary | ICD-10-CM | POA: Diagnosis not present

## 2012-12-13 DIAGNOSIS — L97509 Non-pressure chronic ulcer of other part of unspecified foot with unspecified severity: Secondary | ICD-10-CM | POA: Diagnosis not present

## 2012-12-15 ENCOUNTER — Encounter: Payer: Self-pay | Admitting: Cardiothoracic Surgery

## 2012-12-15 ENCOUNTER — Encounter: Payer: Self-pay | Admitting: Nurse Practitioner

## 2012-12-15 DIAGNOSIS — R609 Edema, unspecified: Secondary | ICD-10-CM | POA: Diagnosis not present

## 2012-12-15 DIAGNOSIS — I87339 Chronic venous hypertension (idiopathic) with ulcer and inflammation of unspecified lower extremity: Secondary | ICD-10-CM | POA: Diagnosis not present

## 2012-12-15 DIAGNOSIS — E785 Hyperlipidemia, unspecified: Secondary | ICD-10-CM | POA: Diagnosis not present

## 2012-12-15 DIAGNOSIS — Z8672 Personal history of thrombophlebitis: Secondary | ICD-10-CM | POA: Diagnosis not present

## 2012-12-15 DIAGNOSIS — L02419 Cutaneous abscess of limb, unspecified: Secondary | ICD-10-CM | POA: Diagnosis not present

## 2012-12-15 DIAGNOSIS — I10 Essential (primary) hypertension: Secondary | ICD-10-CM | POA: Diagnosis not present

## 2012-12-15 DIAGNOSIS — L97509 Non-pressure chronic ulcer of other part of unspecified foot with unspecified severity: Secondary | ICD-10-CM | POA: Diagnosis not present

## 2012-12-17 DIAGNOSIS — Z7901 Long term (current) use of anticoagulants: Secondary | ICD-10-CM | POA: Diagnosis not present

## 2012-12-19 DIAGNOSIS — I10 Essential (primary) hypertension: Secondary | ICD-10-CM | POA: Diagnosis not present

## 2012-12-19 DIAGNOSIS — L97509 Non-pressure chronic ulcer of other part of unspecified foot with unspecified severity: Secondary | ICD-10-CM | POA: Diagnosis not present

## 2012-12-19 DIAGNOSIS — E785 Hyperlipidemia, unspecified: Secondary | ICD-10-CM | POA: Diagnosis not present

## 2012-12-19 DIAGNOSIS — Z8672 Personal history of thrombophlebitis: Secondary | ICD-10-CM | POA: Diagnosis not present

## 2012-12-19 DIAGNOSIS — L03119 Cellulitis of unspecified part of limb: Secondary | ICD-10-CM | POA: Diagnosis not present

## 2012-12-19 DIAGNOSIS — R609 Edema, unspecified: Secondary | ICD-10-CM | POA: Diagnosis not present

## 2012-12-19 DIAGNOSIS — L97909 Non-pressure chronic ulcer of unspecified part of unspecified lower leg with unspecified severity: Secondary | ICD-10-CM | POA: Diagnosis not present

## 2012-12-24 DIAGNOSIS — Z7901 Long term (current) use of anticoagulants: Secondary | ICD-10-CM | POA: Diagnosis not present

## 2012-12-26 DIAGNOSIS — I87339 Chronic venous hypertension (idiopathic) with ulcer and inflammation of unspecified lower extremity: Secondary | ICD-10-CM | POA: Diagnosis not present

## 2012-12-26 DIAGNOSIS — L97509 Non-pressure chronic ulcer of other part of unspecified foot with unspecified severity: Secondary | ICD-10-CM | POA: Diagnosis not present

## 2013-01-07 DIAGNOSIS — L0291 Cutaneous abscess, unspecified: Secondary | ICD-10-CM | POA: Diagnosis not present

## 2013-01-07 DIAGNOSIS — L0390 Cellulitis, unspecified: Secondary | ICD-10-CM | POA: Diagnosis not present

## 2013-01-07 DIAGNOSIS — Z7901 Long term (current) use of anticoagulants: Secondary | ICD-10-CM | POA: Diagnosis not present

## 2013-01-09 DIAGNOSIS — R609 Edema, unspecified: Secondary | ICD-10-CM | POA: Diagnosis not present

## 2013-01-09 DIAGNOSIS — E785 Hyperlipidemia, unspecified: Secondary | ICD-10-CM | POA: Diagnosis not present

## 2013-01-09 DIAGNOSIS — I10 Essential (primary) hypertension: Secondary | ICD-10-CM | POA: Diagnosis not present

## 2013-01-09 DIAGNOSIS — Z8672 Personal history of thrombophlebitis: Secondary | ICD-10-CM | POA: Diagnosis not present

## 2013-01-09 DIAGNOSIS — L97909 Non-pressure chronic ulcer of unspecified part of unspecified lower leg with unspecified severity: Secondary | ICD-10-CM | POA: Diagnosis not present

## 2013-01-09 DIAGNOSIS — L02419 Cutaneous abscess of limb, unspecified: Secondary | ICD-10-CM | POA: Diagnosis not present

## 2013-01-09 DIAGNOSIS — L03119 Cellulitis of unspecified part of limb: Secondary | ICD-10-CM | POA: Diagnosis not present

## 2013-01-09 DIAGNOSIS — L97509 Non-pressure chronic ulcer of other part of unspecified foot with unspecified severity: Secondary | ICD-10-CM | POA: Diagnosis not present

## 2013-01-10 DIAGNOSIS — L0291 Cutaneous abscess, unspecified: Secondary | ICD-10-CM | POA: Diagnosis not present

## 2013-01-10 DIAGNOSIS — Z7901 Long term (current) use of anticoagulants: Secondary | ICD-10-CM | POA: Diagnosis not present

## 2013-01-14 DIAGNOSIS — L97909 Non-pressure chronic ulcer of unspecified part of unspecified lower leg with unspecified severity: Secondary | ICD-10-CM | POA: Diagnosis not present

## 2013-01-14 DIAGNOSIS — L97509 Non-pressure chronic ulcer of other part of unspecified foot with unspecified severity: Secondary | ICD-10-CM | POA: Diagnosis not present

## 2013-01-14 DIAGNOSIS — E785 Hyperlipidemia, unspecified: Secondary | ICD-10-CM | POA: Diagnosis not present

## 2013-01-14 DIAGNOSIS — L03119 Cellulitis of unspecified part of limb: Secondary | ICD-10-CM | POA: Diagnosis not present

## 2013-01-14 DIAGNOSIS — Z8672 Personal history of thrombophlebitis: Secondary | ICD-10-CM | POA: Diagnosis not present

## 2013-01-14 DIAGNOSIS — I10 Essential (primary) hypertension: Secondary | ICD-10-CM | POA: Diagnosis not present

## 2013-01-14 DIAGNOSIS — R609 Edema, unspecified: Secondary | ICD-10-CM | POA: Diagnosis not present

## 2013-01-15 ENCOUNTER — Encounter: Payer: Self-pay | Admitting: Nurse Practitioner

## 2013-01-15 ENCOUNTER — Encounter: Payer: Self-pay | Admitting: Cardiothoracic Surgery

## 2013-01-15 DIAGNOSIS — R609 Edema, unspecified: Secondary | ICD-10-CM | POA: Diagnosis not present

## 2013-01-15 DIAGNOSIS — Z8672 Personal history of thrombophlebitis: Secondary | ICD-10-CM | POA: Diagnosis not present

## 2013-01-15 DIAGNOSIS — I87339 Chronic venous hypertension (idiopathic) with ulcer and inflammation of unspecified lower extremity: Secondary | ICD-10-CM | POA: Diagnosis not present

## 2013-01-15 DIAGNOSIS — L02419 Cutaneous abscess of limb, unspecified: Secondary | ICD-10-CM | POA: Diagnosis not present

## 2013-01-15 DIAGNOSIS — L97209 Non-pressure chronic ulcer of unspecified calf with unspecified severity: Secondary | ICD-10-CM | POA: Diagnosis not present

## 2013-01-15 DIAGNOSIS — I10 Essential (primary) hypertension: Secondary | ICD-10-CM | POA: Diagnosis not present

## 2013-01-15 DIAGNOSIS — E785 Hyperlipidemia, unspecified: Secondary | ICD-10-CM | POA: Diagnosis not present

## 2013-01-21 DIAGNOSIS — L97209 Non-pressure chronic ulcer of unspecified calf with unspecified severity: Secondary | ICD-10-CM | POA: Diagnosis not present

## 2013-01-21 DIAGNOSIS — Z8672 Personal history of thrombophlebitis: Secondary | ICD-10-CM | POA: Diagnosis not present

## 2013-01-21 DIAGNOSIS — E785 Hyperlipidemia, unspecified: Secondary | ICD-10-CM | POA: Diagnosis not present

## 2013-01-21 DIAGNOSIS — L97909 Non-pressure chronic ulcer of unspecified part of unspecified lower leg with unspecified severity: Secondary | ICD-10-CM | POA: Diagnosis not present

## 2013-01-21 DIAGNOSIS — R609 Edema, unspecified: Secondary | ICD-10-CM | POA: Diagnosis not present

## 2013-01-21 DIAGNOSIS — L02419 Cutaneous abscess of limb, unspecified: Secondary | ICD-10-CM | POA: Diagnosis not present

## 2013-01-21 DIAGNOSIS — L03119 Cellulitis of unspecified part of limb: Secondary | ICD-10-CM | POA: Diagnosis not present

## 2013-01-21 DIAGNOSIS — I10 Essential (primary) hypertension: Secondary | ICD-10-CM | POA: Diagnosis not present

## 2013-01-29 DIAGNOSIS — L97209 Non-pressure chronic ulcer of unspecified calf with unspecified severity: Secondary | ICD-10-CM | POA: Diagnosis not present

## 2013-01-29 DIAGNOSIS — Z7901 Long term (current) use of anticoagulants: Secondary | ICD-10-CM | POA: Diagnosis not present

## 2013-01-29 DIAGNOSIS — I87339 Chronic venous hypertension (idiopathic) with ulcer and inflammation of unspecified lower extremity: Secondary | ICD-10-CM | POA: Diagnosis not present

## 2013-01-29 DIAGNOSIS — L02419 Cutaneous abscess of limb, unspecified: Secondary | ICD-10-CM | POA: Diagnosis not present

## 2013-01-29 DIAGNOSIS — L97909 Non-pressure chronic ulcer of unspecified part of unspecified lower leg with unspecified severity: Secondary | ICD-10-CM | POA: Diagnosis not present

## 2013-01-29 DIAGNOSIS — R609 Edema, unspecified: Secondary | ICD-10-CM | POA: Diagnosis not present

## 2013-01-30 DIAGNOSIS — H35359 Cystoid macular degeneration, unspecified eye: Secondary | ICD-10-CM | POA: Diagnosis not present

## 2013-01-30 DIAGNOSIS — H35379 Puckering of macula, unspecified eye: Secondary | ICD-10-CM | POA: Diagnosis not present

## 2013-01-30 DIAGNOSIS — H3589 Other specified retinal disorders: Secondary | ICD-10-CM | POA: Diagnosis not present

## 2013-01-30 DIAGNOSIS — H40009 Preglaucoma, unspecified, unspecified eye: Secondary | ICD-10-CM | POA: Diagnosis not present

## 2013-02-04 DIAGNOSIS — L03119 Cellulitis of unspecified part of limb: Secondary | ICD-10-CM | POA: Diagnosis not present

## 2013-02-04 DIAGNOSIS — I10 Essential (primary) hypertension: Secondary | ICD-10-CM | POA: Diagnosis not present

## 2013-02-04 DIAGNOSIS — L97209 Non-pressure chronic ulcer of unspecified calf with unspecified severity: Secondary | ICD-10-CM | POA: Diagnosis not present

## 2013-02-04 DIAGNOSIS — Z8672 Personal history of thrombophlebitis: Secondary | ICD-10-CM | POA: Diagnosis not present

## 2013-02-04 DIAGNOSIS — E785 Hyperlipidemia, unspecified: Secondary | ICD-10-CM | POA: Diagnosis not present

## 2013-02-04 DIAGNOSIS — R609 Edema, unspecified: Secondary | ICD-10-CM | POA: Diagnosis not present

## 2013-02-04 DIAGNOSIS — I87339 Chronic venous hypertension (idiopathic) with ulcer and inflammation of unspecified lower extremity: Secondary | ICD-10-CM | POA: Diagnosis not present

## 2013-02-05 DIAGNOSIS — B351 Tinea unguium: Secondary | ICD-10-CM | POA: Diagnosis not present

## 2013-02-05 DIAGNOSIS — Z7901 Long term (current) use of anticoagulants: Secondary | ICD-10-CM | POA: Diagnosis not present

## 2013-02-05 DIAGNOSIS — M79609 Pain in unspecified limb: Secondary | ICD-10-CM | POA: Diagnosis not present

## 2013-02-11 DIAGNOSIS — L03119 Cellulitis of unspecified part of limb: Secondary | ICD-10-CM | POA: Diagnosis not present

## 2013-02-11 DIAGNOSIS — Z8672 Personal history of thrombophlebitis: Secondary | ICD-10-CM | POA: Diagnosis not present

## 2013-02-11 DIAGNOSIS — R609 Edema, unspecified: Secondary | ICD-10-CM | POA: Diagnosis not present

## 2013-02-11 DIAGNOSIS — L97209 Non-pressure chronic ulcer of unspecified calf with unspecified severity: Secondary | ICD-10-CM | POA: Diagnosis not present

## 2013-02-11 DIAGNOSIS — E785 Hyperlipidemia, unspecified: Secondary | ICD-10-CM | POA: Diagnosis not present

## 2013-02-11 DIAGNOSIS — I87339 Chronic venous hypertension (idiopathic) with ulcer and inflammation of unspecified lower extremity: Secondary | ICD-10-CM | POA: Diagnosis not present

## 2013-02-11 DIAGNOSIS — I10 Essential (primary) hypertension: Secondary | ICD-10-CM | POA: Diagnosis not present

## 2013-02-14 ENCOUNTER — Encounter: Payer: Self-pay | Admitting: Nurse Practitioner

## 2013-02-14 ENCOUNTER — Encounter: Payer: Self-pay | Admitting: Cardiothoracic Surgery

## 2013-02-14 ENCOUNTER — Encounter (INDEPENDENT_AMBULATORY_CARE_PROVIDER_SITE_OTHER): Payer: Self-pay | Admitting: Ophthalmology

## 2013-02-14 DIAGNOSIS — L905 Scar conditions and fibrosis of skin: Secondary | ICD-10-CM | POA: Diagnosis not present

## 2013-02-14 DIAGNOSIS — Z8672 Personal history of thrombophlebitis: Secondary | ICD-10-CM | POA: Diagnosis not present

## 2013-02-14 DIAGNOSIS — N400 Enlarged prostate without lower urinary tract symptoms: Secondary | ICD-10-CM | POA: Diagnosis not present

## 2013-02-14 DIAGNOSIS — K219 Gastro-esophageal reflux disease without esophagitis: Secondary | ICD-10-CM | POA: Diagnosis not present

## 2013-02-14 DIAGNOSIS — L97209 Non-pressure chronic ulcer of unspecified calf with unspecified severity: Secondary | ICD-10-CM | POA: Diagnosis not present

## 2013-02-14 DIAGNOSIS — I87339 Chronic venous hypertension (idiopathic) with ulcer and inflammation of unspecified lower extremity: Secondary | ICD-10-CM | POA: Diagnosis not present

## 2013-02-14 DIAGNOSIS — I10 Essential (primary) hypertension: Secondary | ICD-10-CM | POA: Diagnosis not present

## 2013-02-14 DIAGNOSIS — L02419 Cutaneous abscess of limb, unspecified: Secondary | ICD-10-CM | POA: Diagnosis not present

## 2013-02-14 DIAGNOSIS — R609 Edema, unspecified: Secondary | ICD-10-CM | POA: Diagnosis not present

## 2013-02-14 DIAGNOSIS — E785 Hyperlipidemia, unspecified: Secondary | ICD-10-CM | POA: Diagnosis not present

## 2013-02-18 DIAGNOSIS — Z7901 Long term (current) use of anticoagulants: Secondary | ICD-10-CM | POA: Diagnosis not present

## 2013-02-18 DIAGNOSIS — M25519 Pain in unspecified shoulder: Secondary | ICD-10-CM | POA: Diagnosis not present

## 2013-02-19 DIAGNOSIS — L97209 Non-pressure chronic ulcer of unspecified calf with unspecified severity: Secondary | ICD-10-CM | POA: Diagnosis not present

## 2013-02-19 DIAGNOSIS — I87339 Chronic venous hypertension (idiopathic) with ulcer and inflammation of unspecified lower extremity: Secondary | ICD-10-CM | POA: Diagnosis not present

## 2013-02-19 DIAGNOSIS — L02419 Cutaneous abscess of limb, unspecified: Secondary | ICD-10-CM | POA: Diagnosis not present

## 2013-02-19 DIAGNOSIS — R609 Edema, unspecified: Secondary | ICD-10-CM | POA: Diagnosis not present

## 2013-02-22 DIAGNOSIS — Z7901 Long term (current) use of anticoagulants: Secondary | ICD-10-CM | POA: Diagnosis not present

## 2013-02-25 DIAGNOSIS — Z9181 History of falling: Secondary | ICD-10-CM | POA: Diagnosis not present

## 2013-02-25 DIAGNOSIS — Z1331 Encounter for screening for depression: Secondary | ICD-10-CM | POA: Diagnosis not present

## 2013-02-25 DIAGNOSIS — E7800 Pure hypercholesterolemia, unspecified: Secondary | ICD-10-CM | POA: Diagnosis not present

## 2013-02-25 DIAGNOSIS — Z86718 Personal history of other venous thrombosis and embolism: Secondary | ICD-10-CM | POA: Diagnosis not present

## 2013-02-26 DIAGNOSIS — I87339 Chronic venous hypertension (idiopathic) with ulcer and inflammation of unspecified lower extremity: Secondary | ICD-10-CM | POA: Diagnosis not present

## 2013-02-26 DIAGNOSIS — L02419 Cutaneous abscess of limb, unspecified: Secondary | ICD-10-CM | POA: Diagnosis not present

## 2013-02-26 DIAGNOSIS — L97209 Non-pressure chronic ulcer of unspecified calf with unspecified severity: Secondary | ICD-10-CM | POA: Diagnosis not present

## 2013-02-26 DIAGNOSIS — R609 Edema, unspecified: Secondary | ICD-10-CM | POA: Diagnosis not present

## 2013-03-05 DIAGNOSIS — I87339 Chronic venous hypertension (idiopathic) with ulcer and inflammation of unspecified lower extremity: Secondary | ICD-10-CM | POA: Diagnosis not present

## 2013-03-05 DIAGNOSIS — L02419 Cutaneous abscess of limb, unspecified: Secondary | ICD-10-CM | POA: Diagnosis not present

## 2013-03-05 DIAGNOSIS — L03119 Cellulitis of unspecified part of limb: Secondary | ICD-10-CM | POA: Diagnosis not present

## 2013-03-05 DIAGNOSIS — R609 Edema, unspecified: Secondary | ICD-10-CM | POA: Diagnosis not present

## 2013-03-05 DIAGNOSIS — L97909 Non-pressure chronic ulcer of unspecified part of unspecified lower leg with unspecified severity: Secondary | ICD-10-CM | POA: Diagnosis not present

## 2013-03-05 DIAGNOSIS — L97209 Non-pressure chronic ulcer of unspecified calf with unspecified severity: Secondary | ICD-10-CM | POA: Diagnosis not present

## 2013-03-07 DIAGNOSIS — H04209 Unspecified epiphora, unspecified lacrimal gland: Secondary | ICD-10-CM | POA: Diagnosis not present

## 2013-03-07 DIAGNOSIS — H35379 Puckering of macula, unspecified eye: Secondary | ICD-10-CM | POA: Diagnosis not present

## 2013-03-07 DIAGNOSIS — H04559 Acquired stenosis of unspecified nasolacrimal duct: Secondary | ICD-10-CM | POA: Diagnosis not present

## 2013-03-12 DIAGNOSIS — L02419 Cutaneous abscess of limb, unspecified: Secondary | ICD-10-CM | POA: Diagnosis not present

## 2013-03-12 DIAGNOSIS — I87339 Chronic venous hypertension (idiopathic) with ulcer and inflammation of unspecified lower extremity: Secondary | ICD-10-CM | POA: Diagnosis not present

## 2013-03-12 DIAGNOSIS — L03119 Cellulitis of unspecified part of limb: Secondary | ICD-10-CM | POA: Diagnosis not present

## 2013-03-12 DIAGNOSIS — L97209 Non-pressure chronic ulcer of unspecified calf with unspecified severity: Secondary | ICD-10-CM | POA: Diagnosis not present

## 2013-03-12 DIAGNOSIS — R609 Edema, unspecified: Secondary | ICD-10-CM | POA: Diagnosis not present

## 2013-03-12 DIAGNOSIS — L97909 Non-pressure chronic ulcer of unspecified part of unspecified lower leg with unspecified severity: Secondary | ICD-10-CM | POA: Diagnosis not present

## 2013-03-15 DIAGNOSIS — M19039 Primary osteoarthritis, unspecified wrist: Secondary | ICD-10-CM | POA: Diagnosis not present

## 2013-03-15 DIAGNOSIS — M25539 Pain in unspecified wrist: Secondary | ICD-10-CM | POA: Diagnosis not present

## 2013-03-17 ENCOUNTER — Encounter: Payer: Self-pay | Admitting: Nurse Practitioner

## 2013-03-17 ENCOUNTER — Encounter: Payer: Self-pay | Admitting: Cardiothoracic Surgery

## 2013-03-17 DIAGNOSIS — Z8672 Personal history of thrombophlebitis: Secondary | ICD-10-CM | POA: Diagnosis not present

## 2013-03-17 DIAGNOSIS — L97909 Non-pressure chronic ulcer of unspecified part of unspecified lower leg with unspecified severity: Secondary | ICD-10-CM | POA: Diagnosis not present

## 2013-03-17 DIAGNOSIS — L97209 Non-pressure chronic ulcer of unspecified calf with unspecified severity: Secondary | ICD-10-CM | POA: Diagnosis not present

## 2013-03-17 DIAGNOSIS — E785 Hyperlipidemia, unspecified: Secondary | ICD-10-CM | POA: Diagnosis not present

## 2013-03-17 DIAGNOSIS — L905 Scar conditions and fibrosis of skin: Secondary | ICD-10-CM | POA: Diagnosis not present

## 2013-03-17 DIAGNOSIS — R609 Edema, unspecified: Secondary | ICD-10-CM | POA: Diagnosis not present

## 2013-03-19 DIAGNOSIS — L97909 Non-pressure chronic ulcer of unspecified part of unspecified lower leg with unspecified severity: Secondary | ICD-10-CM | POA: Diagnosis not present

## 2013-03-19 DIAGNOSIS — L97209 Non-pressure chronic ulcer of unspecified calf with unspecified severity: Secondary | ICD-10-CM | POA: Diagnosis not present

## 2013-03-19 DIAGNOSIS — Z8672 Personal history of thrombophlebitis: Secondary | ICD-10-CM | POA: Diagnosis not present

## 2013-03-19 DIAGNOSIS — R609 Edema, unspecified: Secondary | ICD-10-CM | POA: Diagnosis not present

## 2013-03-19 DIAGNOSIS — E785 Hyperlipidemia, unspecified: Secondary | ICD-10-CM | POA: Diagnosis not present

## 2013-03-19 DIAGNOSIS — L905 Scar conditions and fibrosis of skin: Secondary | ICD-10-CM | POA: Diagnosis not present

## 2013-03-26 DIAGNOSIS — E785 Hyperlipidemia, unspecified: Secondary | ICD-10-CM | POA: Diagnosis not present

## 2013-03-26 DIAGNOSIS — I87339 Chronic venous hypertension (idiopathic) with ulcer and inflammation of unspecified lower extremity: Secondary | ICD-10-CM | POA: Diagnosis not present

## 2013-03-26 DIAGNOSIS — L97209 Non-pressure chronic ulcer of unspecified calf with unspecified severity: Secondary | ICD-10-CM | POA: Diagnosis not present

## 2013-03-26 DIAGNOSIS — L97909 Non-pressure chronic ulcer of unspecified part of unspecified lower leg with unspecified severity: Secondary | ICD-10-CM | POA: Diagnosis not present

## 2013-03-26 DIAGNOSIS — Z8672 Personal history of thrombophlebitis: Secondary | ICD-10-CM | POA: Diagnosis not present

## 2013-03-26 DIAGNOSIS — R609 Edema, unspecified: Secondary | ICD-10-CM | POA: Diagnosis not present

## 2013-03-26 DIAGNOSIS — L905 Scar conditions and fibrosis of skin: Secondary | ICD-10-CM | POA: Diagnosis not present

## 2013-04-02 DIAGNOSIS — Z8672 Personal history of thrombophlebitis: Secondary | ICD-10-CM | POA: Diagnosis not present

## 2013-04-02 DIAGNOSIS — L97909 Non-pressure chronic ulcer of unspecified part of unspecified lower leg with unspecified severity: Secondary | ICD-10-CM | POA: Diagnosis not present

## 2013-04-02 DIAGNOSIS — E785 Hyperlipidemia, unspecified: Secondary | ICD-10-CM | POA: Diagnosis not present

## 2013-04-02 DIAGNOSIS — L905 Scar conditions and fibrosis of skin: Secondary | ICD-10-CM | POA: Diagnosis not present

## 2013-04-02 DIAGNOSIS — L97209 Non-pressure chronic ulcer of unspecified calf with unspecified severity: Secondary | ICD-10-CM | POA: Diagnosis not present

## 2013-04-02 DIAGNOSIS — R609 Edema, unspecified: Secondary | ICD-10-CM | POA: Diagnosis not present

## 2013-04-30 DIAGNOSIS — M25539 Pain in unspecified wrist: Secondary | ICD-10-CM | POA: Diagnosis not present

## 2013-04-30 DIAGNOSIS — M19049 Primary osteoarthritis, unspecified hand: Secondary | ICD-10-CM | POA: Diagnosis not present

## 2013-05-28 DIAGNOSIS — D72829 Elevated white blood cell count, unspecified: Secondary | ICD-10-CM | POA: Diagnosis not present

## 2013-05-28 DIAGNOSIS — E7800 Pure hypercholesterolemia, unspecified: Secondary | ICD-10-CM | POA: Diagnosis not present

## 2013-05-28 DIAGNOSIS — I10 Essential (primary) hypertension: Secondary | ICD-10-CM | POA: Diagnosis not present

## 2013-05-28 DIAGNOSIS — E1129 Type 2 diabetes mellitus with other diabetic kidney complication: Secondary | ICD-10-CM | POA: Diagnosis not present

## 2013-06-03 DIAGNOSIS — M19049 Primary osteoarthritis, unspecified hand: Secondary | ICD-10-CM | POA: Diagnosis not present

## 2013-06-04 DIAGNOSIS — Z961 Presence of intraocular lens: Secondary | ICD-10-CM | POA: Diagnosis not present

## 2013-06-04 DIAGNOSIS — H2510 Age-related nuclear cataract, unspecified eye: Secondary | ICD-10-CM | POA: Diagnosis not present

## 2013-06-06 DIAGNOSIS — Z79899 Other long term (current) drug therapy: Secondary | ICD-10-CM | POA: Diagnosis not present

## 2013-06-14 ENCOUNTER — Encounter: Payer: Self-pay | Admitting: Surgery

## 2013-06-14 DIAGNOSIS — L8990 Pressure ulcer of unspecified site, unspecified stage: Secondary | ICD-10-CM | POA: Diagnosis not present

## 2013-06-14 DIAGNOSIS — M7989 Other specified soft tissue disorders: Secondary | ICD-10-CM | POA: Diagnosis not present

## 2013-06-14 DIAGNOSIS — L97209 Non-pressure chronic ulcer of unspecified calf with unspecified severity: Secondary | ICD-10-CM | POA: Diagnosis not present

## 2013-06-14 DIAGNOSIS — Z86718 Personal history of other venous thrombosis and embolism: Secondary | ICD-10-CM | POA: Diagnosis not present

## 2013-06-17 ENCOUNTER — Encounter: Payer: Self-pay | Admitting: Surgery

## 2013-06-17 DIAGNOSIS — L97909 Non-pressure chronic ulcer of unspecified part of unspecified lower leg with unspecified severity: Secondary | ICD-10-CM | POA: Diagnosis not present

## 2013-06-17 DIAGNOSIS — Z86718 Personal history of other venous thrombosis and embolism: Secondary | ICD-10-CM | POA: Diagnosis not present

## 2013-06-17 DIAGNOSIS — I872 Venous insufficiency (chronic) (peripheral): Secondary | ICD-10-CM | POA: Diagnosis not present

## 2013-06-21 DIAGNOSIS — Z86718 Personal history of other venous thrombosis and embolism: Secondary | ICD-10-CM | POA: Diagnosis not present

## 2013-06-21 DIAGNOSIS — L8990 Pressure ulcer of unspecified site, unspecified stage: Secondary | ICD-10-CM | POA: Diagnosis not present

## 2013-06-21 DIAGNOSIS — I872 Venous insufficiency (chronic) (peripheral): Secondary | ICD-10-CM | POA: Diagnosis not present

## 2013-06-21 DIAGNOSIS — L97909 Non-pressure chronic ulcer of unspecified part of unspecified lower leg with unspecified severity: Secondary | ICD-10-CM | POA: Diagnosis not present

## 2013-07-01 DIAGNOSIS — R21 Rash and other nonspecific skin eruption: Secondary | ICD-10-CM | POA: Diagnosis not present

## 2013-07-05 DIAGNOSIS — M7989 Other specified soft tissue disorders: Secondary | ICD-10-CM | POA: Diagnosis not present

## 2013-07-05 DIAGNOSIS — I83009 Varicose veins of unspecified lower extremity with ulcer of unspecified site: Secondary | ICD-10-CM | POA: Diagnosis not present

## 2013-07-05 DIAGNOSIS — E785 Hyperlipidemia, unspecified: Secondary | ICD-10-CM | POA: Diagnosis not present

## 2013-07-05 DIAGNOSIS — I10 Essential (primary) hypertension: Secondary | ICD-10-CM | POA: Diagnosis not present

## 2013-07-18 DIAGNOSIS — Z23 Encounter for immunization: Secondary | ICD-10-CM | POA: Diagnosis not present

## 2013-08-02 DIAGNOSIS — I87099 Postthrombotic syndrome with other complications of unspecified lower extremity: Secondary | ICD-10-CM | POA: Diagnosis not present

## 2013-08-02 DIAGNOSIS — L97309 Non-pressure chronic ulcer of unspecified ankle with unspecified severity: Secondary | ICD-10-CM | POA: Diagnosis not present

## 2013-08-02 DIAGNOSIS — M7989 Other specified soft tissue disorders: Secondary | ICD-10-CM | POA: Diagnosis not present

## 2013-08-02 DIAGNOSIS — J309 Allergic rhinitis, unspecified: Secondary | ICD-10-CM | POA: Diagnosis not present

## 2013-08-02 DIAGNOSIS — I803 Phlebitis and thrombophlebitis of lower extremities, unspecified: Secondary | ICD-10-CM | POA: Diagnosis not present

## 2013-08-07 DIAGNOSIS — M79609 Pain in unspecified limb: Secondary | ICD-10-CM | POA: Diagnosis not present

## 2013-08-07 DIAGNOSIS — B351 Tinea unguium: Secondary | ICD-10-CM | POA: Diagnosis not present

## 2013-09-04 DIAGNOSIS — J209 Acute bronchitis, unspecified: Secondary | ICD-10-CM | POA: Diagnosis not present

## 2013-10-04 DIAGNOSIS — E1129 Type 2 diabetes mellitus with other diabetic kidney complication: Secondary | ICD-10-CM | POA: Diagnosis not present

## 2013-10-04 DIAGNOSIS — Z125 Encounter for screening for malignant neoplasm of prostate: Secondary | ICD-10-CM | POA: Diagnosis not present

## 2013-10-04 DIAGNOSIS — Z79899 Other long term (current) drug therapy: Secondary | ICD-10-CM | POA: Diagnosis not present

## 2013-10-04 DIAGNOSIS — I10 Essential (primary) hypertension: Secondary | ICD-10-CM | POA: Diagnosis not present

## 2013-10-04 DIAGNOSIS — E7800 Pure hypercholesterolemia, unspecified: Secondary | ICD-10-CM | POA: Diagnosis not present

## 2014-02-06 DIAGNOSIS — E7800 Pure hypercholesterolemia, unspecified: Secondary | ICD-10-CM | POA: Diagnosis not present

## 2014-02-06 DIAGNOSIS — D72829 Elevated white blood cell count, unspecified: Secondary | ICD-10-CM | POA: Diagnosis not present

## 2014-02-06 DIAGNOSIS — E1129 Type 2 diabetes mellitus with other diabetic kidney complication: Secondary | ICD-10-CM | POA: Diagnosis not present

## 2014-02-06 DIAGNOSIS — I10 Essential (primary) hypertension: Secondary | ICD-10-CM | POA: Diagnosis not present

## 2014-03-04 DIAGNOSIS — D485 Neoplasm of uncertain behavior of skin: Secondary | ICD-10-CM | POA: Diagnosis not present

## 2014-03-04 DIAGNOSIS — L570 Actinic keratosis: Secondary | ICD-10-CM | POA: Diagnosis not present

## 2014-03-04 DIAGNOSIS — L821 Other seborrheic keratosis: Secondary | ICD-10-CM | POA: Diagnosis not present

## 2014-03-05 DIAGNOSIS — D0430 Carcinoma in situ of skin of unspecified part of face: Secondary | ICD-10-CM | POA: Diagnosis not present

## 2014-03-05 DIAGNOSIS — D0439 Carcinoma in situ of skin of other parts of face: Secondary | ICD-10-CM | POA: Diagnosis not present

## 2014-04-29 DIAGNOSIS — B351 Tinea unguium: Secondary | ICD-10-CM | POA: Diagnosis not present

## 2014-04-29 DIAGNOSIS — M79609 Pain in unspecified limb: Secondary | ICD-10-CM | POA: Diagnosis not present

## 2014-05-08 DIAGNOSIS — IMO0002 Reserved for concepts with insufficient information to code with codable children: Secondary | ICD-10-CM | POA: Diagnosis not present

## 2014-05-16 DIAGNOSIS — M25519 Pain in unspecified shoulder: Secondary | ICD-10-CM | POA: Diagnosis not present

## 2014-06-04 DIAGNOSIS — L908 Other atrophic disorders of skin: Secondary | ICD-10-CM | POA: Diagnosis not present

## 2014-06-04 DIAGNOSIS — C44320 Squamous cell carcinoma of skin of unspecified parts of face: Secondary | ICD-10-CM | POA: Diagnosis not present

## 2014-06-04 DIAGNOSIS — L918 Other hypertrophic disorders of the skin: Secondary | ICD-10-CM | POA: Diagnosis not present

## 2014-06-04 DIAGNOSIS — L578 Other skin changes due to chronic exposure to nonionizing radiation: Secondary | ICD-10-CM | POA: Diagnosis not present

## 2014-06-04 DIAGNOSIS — L819 Disorder of pigmentation, unspecified: Secondary | ICD-10-CM | POA: Diagnosis not present

## 2014-06-12 DIAGNOSIS — Z23 Encounter for immunization: Secondary | ICD-10-CM | POA: Diagnosis not present

## 2014-06-12 DIAGNOSIS — E7800 Pure hypercholesterolemia, unspecified: Secondary | ICD-10-CM | POA: Diagnosis not present

## 2014-06-12 DIAGNOSIS — D72829 Elevated white blood cell count, unspecified: Secondary | ICD-10-CM | POA: Diagnosis not present

## 2014-06-12 DIAGNOSIS — E1129 Type 2 diabetes mellitus with other diabetic kidney complication: Secondary | ICD-10-CM | POA: Diagnosis not present

## 2014-06-12 DIAGNOSIS — I10 Essential (primary) hypertension: Secondary | ICD-10-CM | POA: Diagnosis not present

## 2014-07-16 DIAGNOSIS — C44320 Squamous cell carcinoma of skin of unspecified parts of face: Secondary | ICD-10-CM | POA: Diagnosis not present

## 2014-07-16 DIAGNOSIS — T148XXA Other injury of unspecified body region, initial encounter: Secondary | ICD-10-CM | POA: Diagnosis not present

## 2014-07-22 DIAGNOSIS — R110 Nausea: Secondary | ICD-10-CM | POA: Diagnosis not present

## 2014-07-22 DIAGNOSIS — D649 Anemia, unspecified: Secondary | ICD-10-CM | POA: Diagnosis not present

## 2014-07-22 DIAGNOSIS — R634 Abnormal weight loss: Secondary | ICD-10-CM | POA: Diagnosis not present

## 2014-07-22 DIAGNOSIS — Z79899 Other long term (current) drug therapy: Secondary | ICD-10-CM | POA: Diagnosis not present

## 2014-08-12 DIAGNOSIS — Z23 Encounter for immunization: Secondary | ICD-10-CM | POA: Diagnosis not present

## 2014-10-20 DIAGNOSIS — D649 Anemia, unspecified: Secondary | ICD-10-CM | POA: Diagnosis not present

## 2014-10-20 DIAGNOSIS — E1129 Type 2 diabetes mellitus with other diabetic kidney complication: Secondary | ICD-10-CM | POA: Diagnosis not present

## 2014-10-20 DIAGNOSIS — E780 Pure hypercholesterolemia: Secondary | ICD-10-CM | POA: Diagnosis not present

## 2014-10-20 DIAGNOSIS — N183 Chronic kidney disease, stage 3 (moderate): Secondary | ICD-10-CM | POA: Diagnosis not present

## 2014-10-20 DIAGNOSIS — Z86718 Personal history of other venous thrombosis and embolism: Secondary | ICD-10-CM | POA: Diagnosis not present

## 2014-10-20 DIAGNOSIS — Z125 Encounter for screening for malignant neoplasm of prostate: Secondary | ICD-10-CM | POA: Diagnosis not present

## 2014-10-20 DIAGNOSIS — I10 Essential (primary) hypertension: Secondary | ICD-10-CM | POA: Diagnosis not present

## 2014-12-23 DIAGNOSIS — R0601 Orthopnea: Secondary | ICD-10-CM | POA: Diagnosis not present

## 2014-12-23 DIAGNOSIS — R0602 Shortness of breath: Secondary | ICD-10-CM | POA: Diagnosis not present

## 2014-12-24 DIAGNOSIS — I700 Atherosclerosis of aorta: Secondary | ICD-10-CM | POA: Diagnosis not present

## 2014-12-24 DIAGNOSIS — J9811 Atelectasis: Secondary | ICD-10-CM | POA: Diagnosis not present

## 2014-12-24 DIAGNOSIS — J90 Pleural effusion, not elsewhere classified: Secondary | ICD-10-CM | POA: Diagnosis not present

## 2014-12-24 DIAGNOSIS — R0602 Shortness of breath: Secondary | ICD-10-CM | POA: Diagnosis not present

## 2014-12-24 DIAGNOSIS — R0601 Orthopnea: Secondary | ICD-10-CM | POA: Diagnosis not present

## 2014-12-26 DIAGNOSIS — I080 Rheumatic disorders of both mitral and aortic valves: Secondary | ICD-10-CM | POA: Diagnosis not present

## 2014-12-26 DIAGNOSIS — I517 Cardiomegaly: Secondary | ICD-10-CM | POA: Diagnosis not present

## 2014-12-26 DIAGNOSIS — R0602 Shortness of breath: Secondary | ICD-10-CM | POA: Diagnosis not present

## 2014-12-26 DIAGNOSIS — I2109 ST elevation (STEMI) myocardial infarction involving other coronary artery of anterior wall: Secondary | ICD-10-CM | POA: Diagnosis not present

## 2014-12-26 DIAGNOSIS — I77810 Thoracic aortic ectasia: Secondary | ICD-10-CM | POA: Diagnosis not present

## 2015-01-16 DIAGNOSIS — I5022 Chronic systolic (congestive) heart failure: Secondary | ICD-10-CM | POA: Diagnosis not present

## 2015-01-16 DIAGNOSIS — I2510 Atherosclerotic heart disease of native coronary artery without angina pectoris: Secondary | ICD-10-CM | POA: Diagnosis not present

## 2015-01-16 DIAGNOSIS — R0609 Other forms of dyspnea: Secondary | ICD-10-CM | POA: Diagnosis not present

## 2015-02-17 DIAGNOSIS — I5022 Chronic systolic (congestive) heart failure: Secondary | ICD-10-CM | POA: Diagnosis not present

## 2015-02-19 DIAGNOSIS — N183 Chronic kidney disease, stage 3 (moderate): Secondary | ICD-10-CM | POA: Diagnosis not present

## 2015-02-19 DIAGNOSIS — Z6823 Body mass index (BMI) 23.0-23.9, adult: Secondary | ICD-10-CM | POA: Diagnosis not present

## 2015-02-19 DIAGNOSIS — Z86718 Personal history of other venous thrombosis and embolism: Secondary | ICD-10-CM | POA: Diagnosis not present

## 2015-02-19 DIAGNOSIS — E780 Pure hypercholesterolemia: Secondary | ICD-10-CM | POA: Diagnosis not present

## 2015-02-19 DIAGNOSIS — Z9181 History of falling: Secondary | ICD-10-CM | POA: Diagnosis not present

## 2015-02-19 DIAGNOSIS — I10 Essential (primary) hypertension: Secondary | ICD-10-CM | POA: Diagnosis not present

## 2015-02-19 DIAGNOSIS — E1140 Type 2 diabetes mellitus with diabetic neuropathy, unspecified: Secondary | ICD-10-CM | POA: Diagnosis not present

## 2015-02-19 DIAGNOSIS — Z1389 Encounter for screening for other disorder: Secondary | ICD-10-CM | POA: Diagnosis not present

## 2015-02-19 DIAGNOSIS — D509 Iron deficiency anemia, unspecified: Secondary | ICD-10-CM | POA: Diagnosis not present

## 2015-03-05 DIAGNOSIS — M25561 Pain in right knee: Secondary | ICD-10-CM | POA: Diagnosis not present

## 2015-03-05 DIAGNOSIS — Z6824 Body mass index (BMI) 24.0-24.9, adult: Secondary | ICD-10-CM | POA: Diagnosis not present

## 2015-03-06 DIAGNOSIS — R531 Weakness: Secondary | ICD-10-CM | POA: Diagnosis not present

## 2015-03-06 DIAGNOSIS — M170 Bilateral primary osteoarthritis of knee: Secondary | ICD-10-CM | POA: Diagnosis not present

## 2015-03-06 DIAGNOSIS — M1712 Unilateral primary osteoarthritis, left knee: Secondary | ICD-10-CM | POA: Diagnosis not present

## 2015-03-06 DIAGNOSIS — M25462 Effusion, left knee: Secondary | ICD-10-CM | POA: Diagnosis not present

## 2015-03-06 DIAGNOSIS — M1711 Unilateral primary osteoarthritis, right knee: Secondary | ICD-10-CM | POA: Diagnosis not present

## 2015-05-21 DIAGNOSIS — Z6822 Body mass index (BMI) 22.0-22.9, adult: Secondary | ICD-10-CM | POA: Diagnosis not present

## 2015-05-21 DIAGNOSIS — T148 Other injury of unspecified body region: Secondary | ICD-10-CM | POA: Diagnosis not present

## 2015-05-21 DIAGNOSIS — M25512 Pain in left shoulder: Secondary | ICD-10-CM | POA: Diagnosis not present

## 2015-05-21 DIAGNOSIS — I5022 Chronic systolic (congestive) heart failure: Secondary | ICD-10-CM | POA: Diagnosis not present

## 2015-05-21 DIAGNOSIS — Z86718 Personal history of other venous thrombosis and embolism: Secondary | ICD-10-CM | POA: Diagnosis not present

## 2015-05-21 DIAGNOSIS — Z9181 History of falling: Secondary | ICD-10-CM | POA: Diagnosis not present

## 2015-05-30 ENCOUNTER — Encounter (HOSPITAL_COMMUNITY): Payer: Self-pay

## 2015-05-30 ENCOUNTER — Emergency Department (HOSPITAL_COMMUNITY): Payer: Medicare Other

## 2015-05-30 ENCOUNTER — Inpatient Hospital Stay (HOSPITAL_COMMUNITY)
Admission: EM | Admit: 2015-05-30 | Discharge: 2015-06-03 | DRG: 682 | Disposition: A | Discharge status: Skilled Nursing Facility | Payer: Medicare Other | Attending: Internal Medicine | Admitting: Internal Medicine

## 2015-05-30 DIAGNOSIS — K219 Gastro-esophageal reflux disease without esophagitis: Secondary | ICD-10-CM | POA: Diagnosis not present

## 2015-05-30 DIAGNOSIS — M47812 Spondylosis without myelopathy or radiculopathy, cervical region: Secondary | ICD-10-CM | POA: Diagnosis not present

## 2015-05-30 DIAGNOSIS — E871 Hypo-osmolality and hyponatremia: Secondary | ICD-10-CM | POA: Diagnosis present

## 2015-05-30 DIAGNOSIS — R630 Anorexia: Secondary | ICD-10-CM | POA: Diagnosis not present

## 2015-05-30 DIAGNOSIS — I447 Left bundle-branch block, unspecified: Secondary | ICD-10-CM | POA: Diagnosis present

## 2015-05-30 DIAGNOSIS — W19XXXA Unspecified fall, initial encounter: Secondary | ICD-10-CM | POA: Diagnosis present

## 2015-05-30 DIAGNOSIS — M25511 Pain in right shoulder: Secondary | ICD-10-CM | POA: Diagnosis not present

## 2015-05-30 DIAGNOSIS — B029 Zoster without complications: Secondary | ICD-10-CM | POA: Diagnosis present

## 2015-05-30 DIAGNOSIS — R748 Abnormal levels of other serum enzymes: Secondary | ICD-10-CM | POA: Diagnosis present

## 2015-05-30 DIAGNOSIS — S199XXA Unspecified injury of neck, initial encounter: Secondary | ICD-10-CM | POA: Diagnosis not present

## 2015-05-30 DIAGNOSIS — I5022 Chronic systolic (congestive) heart failure: Secondary | ICD-10-CM | POA: Diagnosis not present

## 2015-05-30 DIAGNOSIS — I2510 Atherosclerotic heart disease of native coronary artery without angina pectoris: Secondary | ICD-10-CM | POA: Diagnosis not present

## 2015-05-30 DIAGNOSIS — E860 Dehydration: Secondary | ICD-10-CM | POA: Diagnosis present

## 2015-05-30 DIAGNOSIS — Z7952 Long term (current) use of systemic steroids: Secondary | ICD-10-CM

## 2015-05-30 DIAGNOSIS — Z79899 Other long term (current) drug therapy: Secondary | ICD-10-CM | POA: Diagnosis not present

## 2015-05-30 DIAGNOSIS — S59901A Unspecified injury of right elbow, initial encounter: Secondary | ICD-10-CM | POA: Diagnosis not present

## 2015-05-30 DIAGNOSIS — I429 Cardiomyopathy, unspecified: Secondary | ICD-10-CM | POA: Diagnosis not present

## 2015-05-30 DIAGNOSIS — Z6823 Body mass index (BMI) 23.0-23.9, adult: Secondary | ICD-10-CM

## 2015-05-30 DIAGNOSIS — R509 Fever, unspecified: Secondary | ICD-10-CM

## 2015-05-30 DIAGNOSIS — L089 Local infection of the skin and subcutaneous tissue, unspecified: Secondary | ICD-10-CM | POA: Diagnosis not present

## 2015-05-30 DIAGNOSIS — E43 Unspecified severe protein-calorie malnutrition: Secondary | ICD-10-CM | POA: Diagnosis present

## 2015-05-30 DIAGNOSIS — Z86718 Personal history of other venous thrombosis and embolism: Secondary | ICD-10-CM | POA: Diagnosis not present

## 2015-05-30 DIAGNOSIS — E780 Pure hypercholesterolemia: Secondary | ICD-10-CM | POA: Diagnosis not present

## 2015-05-30 DIAGNOSIS — I10 Essential (primary) hypertension: Secondary | ICD-10-CM | POA: Diagnosis present

## 2015-05-30 DIAGNOSIS — M79631 Pain in right forearm: Secondary | ICD-10-CM | POA: Diagnosis not present

## 2015-05-30 DIAGNOSIS — R5381 Other malaise: Secondary | ICD-10-CM

## 2015-05-30 DIAGNOSIS — N179 Acute kidney failure, unspecified: Principal | ICD-10-CM | POA: Diagnosis present

## 2015-05-30 DIAGNOSIS — R778 Other specified abnormalities of plasma proteins: Secondary | ICD-10-CM | POA: Diagnosis present

## 2015-05-30 DIAGNOSIS — R7989 Other specified abnormal findings of blood chemistry: Secondary | ICD-10-CM | POA: Diagnosis present

## 2015-05-30 DIAGNOSIS — R51 Headache: Secondary | ICD-10-CM | POA: Diagnosis not present

## 2015-05-30 DIAGNOSIS — E876 Hypokalemia: Secondary | ICD-10-CM | POA: Diagnosis not present

## 2015-05-30 DIAGNOSIS — S0990XA Unspecified injury of head, initial encounter: Secondary | ICD-10-CM | POA: Diagnosis not present

## 2015-05-30 DIAGNOSIS — Z7901 Long term (current) use of anticoagulants: Secondary | ICD-10-CM

## 2015-05-30 DIAGNOSIS — S4991XA Unspecified injury of right shoulder and upper arm, initial encounter: Secondary | ICD-10-CM | POA: Diagnosis not present

## 2015-05-30 DIAGNOSIS — Z23 Encounter for immunization: Secondary | ICD-10-CM | POA: Diagnosis not present

## 2015-05-30 DIAGNOSIS — M542 Cervicalgia: Secondary | ICD-10-CM | POA: Diagnosis not present

## 2015-05-30 DIAGNOSIS — M5032 Other cervical disc degeneration, mid-cervical region: Secondary | ICD-10-CM | POA: Diagnosis not present

## 2015-05-30 DIAGNOSIS — D509 Iron deficiency anemia, unspecified: Secondary | ICD-10-CM | POA: Diagnosis present

## 2015-05-30 DIAGNOSIS — S59911A Unspecified injury of right forearm, initial encounter: Secondary | ICD-10-CM | POA: Diagnosis not present

## 2015-05-30 DIAGNOSIS — M79621 Pain in right upper arm: Secondary | ICD-10-CM | POA: Diagnosis not present

## 2015-05-30 LAB — COMPREHENSIVE METABOLIC PANEL
ALBUMIN: 2.8 g/dL — AB (ref 3.5–5.0)
ALT: 21 U/L (ref 17–63)
ANION GAP: 11 (ref 5–15)
AST: 42 U/L — AB (ref 15–41)
Alkaline Phosphatase: 96 U/L (ref 38–126)
BILIRUBIN TOTAL: 1.1 mg/dL (ref 0.3–1.2)
BUN: 26 mg/dL — AB (ref 6–20)
CALCIUM: 9 mg/dL (ref 8.9–10.3)
CHLORIDE: 97 mmol/L — AB (ref 101–111)
CO2: 22 mmol/L (ref 22–32)
Creatinine, Ser: 1.5 mg/dL — ABNORMAL HIGH (ref 0.61–1.24)
GFR, EST AFRICAN AMERICAN: 47 mL/min — AB (ref >60)
GFR, EST NON AFRICAN AMERICAN: 40 mL/min — AB (ref >60)
GLUCOSE: 118 mg/dL — AB (ref 65–99)
POTASSIUM: 3.7 mmol/L (ref 3.5–5.1)
Sodium: 130 mmol/L — ABNORMAL LOW (ref 135–145)
Total Protein: 7.4 g/dL (ref 6.5–8.1)

## 2015-05-30 LAB — URINALYSIS, ROUTINE W REFLEX MICROSCOPIC
BILIRUBIN URINE: NEGATIVE
Glucose, UA: NEGATIVE mg/dL
Ketones, ur: NEGATIVE mg/dL
Leukocytes, UA: NEGATIVE
NITRITE: NEGATIVE
PH: 5.5 (ref 5.0–8.0)
PROTEIN: 30 mg/dL — AB
Specific Gravity, Urine: 1.017 (ref 1.005–1.030)
Urobilinogen, UA: 1 mg/dL (ref 0.0–1.0)

## 2015-05-30 LAB — CBC
HEMATOCRIT: 32.9 % — AB (ref 39.0–52.0)
Hemoglobin: 11.4 g/dL — ABNORMAL LOW (ref 13.0–17.0)
MCH: 29.5 pg (ref 26.0–34.0)
MCHC: 34.7 g/dL (ref 30.0–36.0)
MCV: 85.2 fL (ref 78.0–100.0)
Platelets: 246 10*3/uL (ref 150–400)
RBC: 3.86 MIL/uL — AB (ref 4.22–5.81)
RDW: 13.6 % (ref 11.5–15.5)
WBC: 13.5 10*3/uL — AB (ref 4.0–10.5)

## 2015-05-30 LAB — TROPONIN I
TROPONIN I: 0.04 ng/mL — AB (ref <0.031)
TROPONIN I: 0.09 ng/mL — AB (ref <0.031)
Troponin I: 0.06 ng/mL — ABNORMAL HIGH (ref <0.031)

## 2015-05-30 LAB — PROTIME-INR
INR: 1.32 (ref 0.00–1.49)
PROTHROMBIN TIME: 16.5 s — AB (ref 11.6–15.2)

## 2015-05-30 LAB — URINE MICROSCOPIC-ADD ON

## 2015-05-30 MED ORDER — POLYETHYLENE GLYCOL 3350 17 G PO PACK: 17.0000 g | PACK | Freq: Every day | ORAL | Status: DC | PRN

## 2015-05-30 MED ORDER — SODIUM CHLORIDE 0.9 % IV SOLN
INTRAVENOUS | Status: AC
  Administered 2015-05-31: 17:00:00 via INTRAVENOUS

## 2015-05-30 MED ORDER — MONTELUKAST SODIUM 10 MG PO TABS
10.0000 mg | ORAL_TABLET | Freq: Every day | ORAL | Status: DC
  Administered 2015-05-31 – 2015-06-03 (×4): 10 mg via ORAL
  Filled 2015-05-30 (×4): qty 1

## 2015-05-30 MED ORDER — HYDROCODONE-ACETAMINOPHEN 5-325 MG PO TABS: 1.0000 | ORAL_TABLET | Freq: Four times a day (QID) | ORAL | Status: DC | PRN

## 2015-05-30 MED ORDER — METOPROLOL SUCCINATE ER 25 MG PO TB24: 12.5000 mg | ORAL_TABLET | Freq: Every day | ORAL | Status: DC

## 2015-05-30 MED ORDER — ACYCLOVIR SODIUM 50 MG/ML IV SOLN
10.0000 mg/kg | Freq: Two times a day (BID) | INTRAVENOUS | Status: DC
  Administered 2015-05-30 – 2015-06-01 (×4): 800 mg via INTRAVENOUS
  Filled 2015-05-30 (×5): qty 16

## 2015-05-30 MED ORDER — METOPROLOL SUCCINATE ER 25 MG PO TB24
12.5000 mg | ORAL_TABLET | Freq: Every day | ORAL | Status: DC
  Administered 2015-05-31 – 2015-06-03 (×4): 12.5 mg via ORAL
  Filled 2015-05-30 (×4): qty 1

## 2015-05-30 MED ORDER — ACYCLOVIR 400 MG PO TABS: 400.0000 mg | ORAL_TABLET | Freq: Four times a day (QID) | ORAL | Status: DC

## 2015-05-30 MED ORDER — ATORVASTATIN CALCIUM 40 MG PO TABS
40.0000 mg | ORAL_TABLET | Freq: Every day | ORAL | Status: DC
  Administered 2015-05-30 – 2015-06-02 (×4): 40 mg via ORAL
  Filled 2015-05-30 (×4): qty 1

## 2015-05-30 MED ORDER — SODIUM CHLORIDE 0.9 % IV BOLUS (SEPSIS)
1000.0000 mL | Freq: Once | INTRAVENOUS | Status: AC
  Administered 2015-05-30: 1000 mL via INTRAVENOUS

## 2015-05-30 MED ORDER — DOCUSATE SODIUM 100 MG PO CAPS
100.0000 mg | ORAL_CAPSULE | Freq: Every day | ORAL | Status: DC
Start: 2015-05-31 — End: 2015-06-03
  Administered 2015-06-02 – 2015-06-03 (×2): 100 mg via ORAL
  Filled 2015-05-30 (×4): qty 1

## 2015-05-30 MED ORDER — PREDNISONE 20 MG PO TABS
ORAL_TABLET | ORAL | Status: DC
Start: 2015-05-30 — End: 2015-06-02

## 2015-05-30 MED ORDER — ACETAMINOPHEN 325 MG PO TABS
650.0000 mg | ORAL_TABLET | Freq: Four times a day (QID) | ORAL | Status: DC | PRN
Start: 2015-05-30 — End: 2015-06-03
  Administered 2015-05-30 – 2015-06-02 (×3): 650 mg via ORAL
  Filled 2015-05-30 (×3): qty 2

## 2015-05-30 MED ORDER — SODIUM CHLORIDE 0.9 % IV BOLUS (SEPSIS)
500.0000 mL | Freq: Once | INTRAVENOUS | Status: AC
  Administered 2015-05-30: 500 mL via INTRAVENOUS

## 2015-05-30 MED ORDER — SODIUM CHLORIDE 0.9 % IJ SOLN
3.0000 mL | Freq: Two times a day (BID) | INTRAMUSCULAR | Status: DC
  Administered 2015-05-31 – 2015-06-02 (×6): 3 mL via INTRAVENOUS

## 2015-05-30 MED ORDER — ACETAMINOPHEN 650 MG RE SUPP: 650.0000 mg | Freq: Four times a day (QID) | RECTAL | Status: DC | PRN

## 2015-05-30 MED ORDER — RIVAROXABAN 20 MG PO TABS
20.0000 mg | ORAL_TABLET | Freq: Every day | ORAL | Status: DC
  Administered 2015-05-31 – 2015-06-02 (×3): 20 mg via ORAL
  Filled 2015-05-30 (×3): qty 1

## 2015-05-30 MED ORDER — FERROUS SULFATE 325 (65 FE) MG PO TABS
325.0000 mg | ORAL_TABLET | Freq: Every day | ORAL | Status: DC
  Administered 2015-05-31 – 2015-06-03 (×4): 325 mg via ORAL
  Filled 2015-05-30 (×4): qty 1

## 2015-05-30 MED ORDER — TETANUS-DIPHTH-ACELL PERTUSSIS 5-2.5-18.5 LF-MCG/0.5 IM SUSP
0.5000 mL | Freq: Once | INTRAMUSCULAR | Status: AC
  Administered 2015-05-30: 0.5 mL via INTRAMUSCULAR
  Filled 2015-05-30: qty 0.5

## 2015-05-30 NOTE — Progress Notes (Signed)
ANTIBIOTIC CONSULT NOTE - INITIAL  Pharmacy Consult for acyclovir IV Indication: possible shingles  No Known Allergies  Patient Measurements: Height: 6\' 1"  (185.4 cm) Weight: 176 lb 14.4 oz (80.241 kg) IBW/kg (Calculated) : 79.9 Adjusted Body Weight:   Vital Signs: Temp: 100.2 F (37.9 C) (08/13 2057) Temp Source: Oral (08/13 2057) BP: 179/68 mmHg (08/13 2057) Pulse Rate: 106 (08/13 2018) Intake/Output from previous day:   Intake/Output from this shift:    Labs:  Recent Labs  05/30/15 1330  WBC 13.5*  HGB 11.4*  PLT 246  CREATININE 1.50*   Estimated Creatinine Clearance: 40 mL/min (by C-G formula based on Cr of 1.5). No results for input(s): VANCOTROUGH, VANCOPEAK, VANCORANDOM, GENTTROUGH, GENTPEAK, GENTRANDOM, TOBRATROUGH, TOBRAPEAK, TOBRARND, AMIKACINPEAK, AMIKACINTROU, AMIKACIN in the last 72 hours.   Microbiology: No results found for this or any previous visit (from the past 720 hour(s)).  Medical History: Past Medical History  Diagnosis Date  . Coronary artery disease   . Hypertension   . High cholesterol   . GERD (gastroesophageal reflux disease)   . DVT (deep venous thrombosis)     Medications:  Scheduled:  . atorvastatin  40 mg Oral QHS  . [START ON 05/31/2015] docusate sodium  100 mg Oral Daily  . [START ON 05/31/2015] ferrous sulfate  325 mg Oral Daily  . [START ON 05/31/2015] metoprolol succinate  12.5 mg Oral Daily  . [START ON 05/31/2015] montelukast  10 mg Oral Daily  . [START ON 05/31/2015] rivaroxaban  20 mg Oral Q supper  . sodium chloride  3 mL Intravenous Q12H   Infusions:  . sodium chloride     Assessment: 81 yo male with possible shingles will be started on acyclovir.  CrCl ~40. IBW 79.9 kg  Goal of Therapy:  Resolution of infection  Plan:  - acyclovir 800 mg iv q12h - f/u on CMet and CBC as needed  Anielle Headrick, Tsz-Yin 05/30/2015,9:08 PM

## 2015-05-30 NOTE — ED Notes (Signed)
Assisted to the bathroom via wheelchair, 1 standby assist. Transfers with assistance uses cane at home.

## 2015-05-30 NOTE — ED Notes (Signed)
Per EMS, Patient had unwitnessed fall ten days ago. Patient is unable to lift his right arm. Patient is on Xarelto for DVT. Since the fall, patient has had lack of appetite and starting today, patient has AMS and mild headache. Patient denies hitting head with fall when EMS assessed patient on the day of the fall. Per EMS, Patient is alert and orient x4. Vitals per EMS: 110/71, 93 HR, 98%, 18 RR.

## 2015-05-30 NOTE — ED Provider Notes (Addendum)
CSN: [id number]     Arrival date & time 05/30/15  1225 History   First MD Initiated Contact with Patient 05/30/15 1234     Chief Complaint  Patient presents with  . Fall     (Consider location/radiation/quality/duration/timing/severity/associated sxs/prior Treatment) Patient is a 81 y.o. male presenting with fall. The history is provided by the patient.  Fall Pertinent negatives include no chest pain, no abdominal pain and no shortness of breath.  Patient w hx cad, htn, presents c/o generalized weakness and poor po intake since fall approximately 10 days ago.  [place] was trying to step on curb, and states tried to avoid child on scooter, and lost balance. Denies loc. Is on xarelto. Denies abn bleeding or bruising. In past several days, decreased appetite, very poor po intake. Mild frontal headache. No neck or back pain. Also c/o left arm pain post fall, unable to localize, but family notes has not been using arm. +skin tears to forearm and hand. Denies fever/chills. No chest pain. Denies sob. No cough or uri c/o. No abd pain. No nvd. No dysuria or gu c/o. No recent change in meds. Pt is noted to have rash right cheek/nose c/w shingles - family had noticed in past week, but was unsure if due to recent fall.  Pt indicates area has been painful, constant/burning - family/pt had been unaware of dx shingles prior to ED visit today.  No eye pain or change in vision.      Past Medical History  Diagnosis Date  . Coronary artery disease   . Hypertension   . High cholesterol   . GERD (gastroesophageal reflux disease)   . DVT (deep venous thrombosis)    Past Surgical History  Procedure Laterality Date  . Appendectomy    . Prostate surgery      No family history on file. Social History  Substance Use Topics  . Smoking status: Never Smoker   . Smokeless tobacco: Never Used  . Alcohol Use: No    Review of Systems  Constitutional: Negative for fever and chills.   HENT: Negative for sore throat.   Eyes: Negative for pain and redness.  Respiratory: Negative for shortness of breath.   Cardiovascular: Negative for chest pain.  Gastrointestinal: Negative for vomiting, abdominal pain and diarrhea.  Genitourinary: Negative for dysuria and flank pain.  Musculoskeletal: Negative for back pain and neck pain.  Skin: Positive for wound.  Neurological: Negative for syncope, weakness and numbness.  Hematological: Does not bruise/bleed easily.  Psychiatric/Behavioral: Negative for confusion.      Allergies  Review of patient's allergies indicates no known allergies.  Home Medications   Prior to Admission medications   Medication Sig Start Date End Date Taking? Authorizing Provider  lisinopril (PRINIVIL,ZESTRIL) 2.5 MG tablet Take 2.5 mg by mouth daily.    Historical Provider, MD  Multiple Vitamin (MULTIVITAMIN WITH MINERALS) TABS Take 1 tablet by mouth daily. Men's One a Day    Historical Provider, MD  omeprazole (PRILOSEC) 20 MG capsule Take 20 mg by mouth daily.    Historical Provider, MD  pravastatin (PRAVACHOL) 80 MG tablet Take 80 mg by mouth every evening.    Historical Provider, MD  sulfamethoxazole-trimethoprim (BACTRIM DS) 800-160 MG per tablet Take 1 tablet by mouth 2 (two) times daily. 10 day course, filled and picked up 10/11/12 - this is second course of this drug    Historical Provider, MD  Tamsulosin HCl (FLOMAX) 0.4 MG CAPS Take 0.4  mg by mouth daily.    Historical Provider, MD   BP 120/60 mmHg  Pulse 90  Temp(Src) 99.9 F (37.7 C) (Oral)  Resp 22  SpO2 97% Physical Exam  Constitutional: He is oriented to person, place, and time. He appears well-developed and well-nourished. No distress.  HENT:  Head: Atraumatic.  Mouth/Throat: Oropharynx is clear and moist.  Eyes: Conjunctivae are normal. Pupils are equal, round, and reactive to light. No scleral icterus.  Conj not injected, no dendritic pattern or ocular herpetic lesions.    Neck: Neck supple. No tracheal deviation present.  No stiffness or rigidity. No bruit  Cardiovascular: Normal rate, regular rhythm, normal heart sounds and intact distal pulses.  Exam reveals no gallop and no friction rub.   No murmur heard. Pulmonary/Chest: Effort normal and breath sounds normal. No accessory muscle usage. No respiratory distress. He exhibits no tenderness.  Abdominal: Soft. Bowel sounds are normal. He exhibits no distension and no mass. There is no tenderness. There is no rebound and no guarding.  Genitourinary:  No cva tenderness  Musculoskeletal: Normal range of motion.  Mid to lower cervical tenderness, otherwise, CTLS spine, non tender, aligned, no step off. Pain w rom right shoulder, elbow and forearm, ?mild sts right prox humerus. Distal pulses palp. Skin tears to elbow, forearm and hand without sign of infection. Radial pulse 2+.  Otherwise good rom bil ext, no other focal bony tenderness noted.     Neurological: He is alert and oriented to person, place, and time.  Speech clear/fluent. Motor intact bil, stre 5/5. sens grossly intact.   Skin: Skin is warm and dry. He is not diaphoretic.  Psychiatric: He has a normal mood and affect.  Nursing note and vitals reviewed.   ED Course  Procedures (including critical care time) Labs Review     I, [place], personally reviewed and evaluated these images and lab results as part of my medical decision-making.   EKG Interpretation   Date/Time:  Saturday May 30 2015 12:28:29 EDT Ventricular Rate:  93 PR Interval:  58 QRS Duration: 151 QT Interval:  420 QTC Calculation: 522 R Axis:   -23 Text Interpretation:  Sinus rhythm Left bundle branch block No significant  change since last tracing Confirmed by Ashok Cordia  MD, Lennette Bihari ([id number]) on  05/30/2015 1:16:54 PM      MDM   Iv ns. Labs. Monitor.   Reviewed nursing notes and prior charts for additional history.   Imaging studies.  Pt continues to c/o  pain w rom rue, tenderness from upper humerus to distal forearm - will add additional xrays to assess, as initial shoulder and elbow xrays neg acute.   Iv ns bolus.   ?whether symptoms, low grade fever, malaise, decreased appetite related to shingles, or other acute process - UA/labs remain pending.   Recheck pt comfortable. No new c/o. Currently denies headache. No cp or sob. No eye pain.   Trop .04. No current or recent cp or sob.  Will repeat/do delta trop to make sure not high or increasing.   UA remains pending - signed out to Dr Audie Pinto - if ua neg and trop neg, may d/c to home w rx shingles.       Lajean Saver, MD 05/30/15 [phone]

## 2015-05-30 NOTE — ED Notes (Signed)
(  Soft food) dinner tray has been ordered.

## 2015-05-30 NOTE — ED Provider Notes (Signed)
Second troponin more elevated than first.  We'll place an observation admit for cycling of troponins.  Leonard Schwartz, MD 05/30/15 [phone]

## 2015-05-30 NOTE — ED Notes (Signed)
Pt unable to provide urine sample.

## 2015-05-30 NOTE — ED Notes (Signed)
Pt is eating food.

## 2015-05-30 NOTE — H&P (Addendum)
[facility] History and Physical  Numair Masden QPR:[id number] DOB: 06/25/1927 DOA: 05/30/2015  Referring physician: Dr. Audie Pinto PCP: Vidal Schwalbe, MD   Chief Complaint: fall  HPI: Dayvion Sans is a 81 y.o. male past medical history of essential hypertension and a fall 2 weeks ago, he denies any loss of consciousness, decreased appetite for the last several days with oral intake and mild frontal headache. He denies neck pain, back pain or chest pain has some left arm pain after fall. He denies any abdominal pain, nausea, vomiting pop, dysuria, no changes in his meds, he's noticed a rash in the right side of his face.   In the ED: [facility] have new acute renal failure with hyponatremia and hypokalemia mild elevated LFTs, mild elevation in his troponins with a mild leukocytosis so we were consulted for further evaluation.   Review of Systems:  Constitutional:  No weight loss, night sweats, Fevers, chills, fatigue.  HEENT:  No headaches, Difficulty swallowing,Tooth/dental problems,Sore throat,  No sneezing, itching, ear ache, nasal congestion, post nasal drip,  Cardio-vascular:  No chest pain, Orthopnea, PND, swelling in lower extremities, anasarca, dizziness, palpitations  GI:  No heartburn, indigestion, abdominal pain, nausea, vomiting, diarrhea, change in bowel habits, loss of appetite  Resp:  No shortness of breath with exertion or at rest. No excess mucus, no productive cough, No non-productive cough, No coughing up of blood.No change in color of mucus.No wheezing.No chest wall deformity  GU:  no dysuria, change in color of urine, no urgency or frequency. No flank pain.  Musculoskeletal:  No joint pain or swelling. No decreased range of motion. No back pain.  Psych:  No change in mood or affect. No depression or anxiety. No memory loss.   Past Medical History  Diagnosis Date  . Coronary artery disease   . Hypertension   . High cholesterol   . GERD (gastroesophageal reflux  disease)   . DVT (deep venous thrombosis)    Past Surgical History  Procedure Laterality Date  . Appendectomy    . Prostate surgery      Social History:  reports that he has never smoked. He has never used smokeless tobacco. He reports that he does not drink alcohol or use illicit drugs.  No Known Allergies  Family History  Problem Relation Age of Onset  . Cancer - Other Mother   . Heart attack Father     Prior to Admission medications   Medication Sig Start Date End Date Taking? Authorizing Provider  atorvastatin (LIPITOR) 40 MG tablet Take 40 mg by mouth at bedtime.   Yes Historical Provider, MD  docusate sodium (COLACE) 100 MG capsule Take 100 mg by mouth daily.   Yes Historical Provider, MD  ferrous sulfate 325 (65 FE) MG tablet Take 325 mg by mouth daily.   Yes Historical Provider, MD  lisinopril (PRINIVIL,ZESTRIL) 10 MG tablet Take 10 mg by mouth daily.   Yes Historical Provider, MD  metoprolol succinate (TOPROL-XL) 25 MG 24 hr tablet Take 12.5 mg by mouth daily. 05/21/15 05/20/16 Yes Historical Provider, MD  montelukast (SINGULAIR) 10 MG tablet Take 10 mg by mouth daily.   Yes Historical Provider, MD  Tamsulosin HCl (FLOMAX) 0.4 MG CAPS Take 0.4 mg by mouth daily.   Yes Historical Provider, MD  XARELTO 20 MG TABS tablet Take 20 mg by mouth daily. 05/21/15  Yes Historical Provider, MD  acyclovir (ZOVIRAX) 400 MG tablet Take 1 tablet (400 mg total) by mouth 4 (four) times daily. 05/30/15  Lajean Saver, MD  HYDROcodone-acetaminophen (NORCO/VICODIN) 5-325 MG per tablet Take 1 tablet by mouth every 6 (six) hours as needed for moderate pain. 05/30/15   Lajean Saver, MD  predniSONE (DELTASONE) 20 MG tablet 2 po once a day for 5 days, then 1 po once a day for 5 days 05/30/15   Lajean Saver, MD   Physical Exam: Filed Vitals:   05/30/15 1300 05/30/15 1321 05/30/15 1444 05/30/15 1653  BP: 131/66 131/66 134/83 137/73  Pulse: 88 86 87 100  Temp:      TempSrc:      Resp: 18 19 15 17   SpO2:  98% 97% 99% 99%    Wt Readings from Last 3 Encounters:  10/11/12 87.3 kg (192 lb 7.4 oz)    General:  Appears calm and comfortable, has a rash on the right side of his face which is mildly tender  Eyes: PERRL, normal lids, irises & conjunctiva ENT: grossly normal hearing, lips & tongue Neck: no LAD, masses or thyromegaly Cardiovascular: RRR, no m/r1+ lower extremity edema  Telemetry: SR, no arrhythmias  Respiratory: CTA bilaterally, no w/r/r. Normal respiratory effort. Abdomen: soft, ntnd Skin: multiple bruises on arms and legs  Musculoskeletal: grossly normal tone BUE/BLE Psychiatric: grossly normal mood and affect, speech fluent and appropriate Neurologic: grossly non-focal.          Labs on Admission:  Basic Metabolic Panel:  Recent Labs Lab 05/30/15 1330  NA 130*  K 3.7  CL 97*  CO2 22  GLUCOSE 118*  BUN 26*  CREATININE 1.50*  CALCIUM 9.0   Liver Function Tests:  Recent Labs Lab 05/30/15 1330  AST 42*  ALT 21  ALKPHOS 96  BILITOT 1.1  PROT 7.4  ALBUMIN 2.8*   No results for input(s): LIPASE, AMYLASE in the last 168 hours. No results for input(s): AMMONIA in the last 168 hours. CBC:  Recent Labs Lab 05/30/15 1330  WBC 13.5*  HGB 11.4*  HCT 32.9*  MCV 85.2  PLT 246   Cardiac Enzymes:  Recent Labs Lab 05/30/15 1330 05/30/15 1703  TROPONINI 0.04* 0.06*    BNP (last 3 results) No results for input(s): BNP in the last 8760 hours.  ProBNP (last 3 results) No results for input(s): PROBNP in the last 8760 hours.  CBG: No results for input(s): GLUCAP in the last 168 hours.  Radiological Exams on Admission: Dg Shoulder Right  05/30/2015   CLINICAL DATA:  81 year old male with history of fall 10 days ago complaining of pain in the right shoulder unable to lift the right arm.  EXAM: RIGHT SHOULDER - 2+ VIEW  COMPARISON:  No priors.  FINDINGS: Large subacromial spur. Degenerative changes of osteoarthritis at the acromioclavicular and  glenohumeral joints. No acute displaced fracture, subluxation or dislocation.  IMPRESSION: 1. No acute radiographic abnormality of the right shoulder. 2. Degenerative changes of osteoarthritis in the glenohumeral and acromioclavicular joints. 3. Large subacromial spur.   Electronically Signed   By: Vinnie Langton M.D.   On: 05/30/2015 14:33   Dg Elbow Complete Right  05/30/2015   CLINICAL DATA:  Status post fall 10 days ago.  Right arm pain.  EXAM: RIGHT ELBOW - COMPLETE 3+ VIEW  COMPARISON:  None.  FINDINGS: Small calcification along the right radial head. There is no evidence of fracture, dislocation, or joint effusion. There is no evidence of arthropathy or other focal bone abnormality. Soft tissues are unremarkable.  IMPRESSION: 1. No acute fracture or dislocation of the right elbow. 2. Calcification  along the right radial head which may be within the soft tissues and are likely dystrophic. The appearance is atypical for an avulsive injury.   Electronically Signed   By: Kathreen Devoid   On: 05/30/2015 14:45   Dg Forearm Right  05/30/2015   CLINICAL DATA:  Fall, pain  EXAM: RIGHT FOREARM - 2 VIEW  COMPARISON:  Right elbow same day  FINDINGS: Two views of the right forearm submitted. No acute fracture or subluxation. No radiopaque foreign body.  IMPRESSION: Negative.   Electronically Signed   By: Lahoma Crocker M.D.   On: 05/30/2015 15:33   Ct Head Wo Contrast  05/30/2015   CLINICAL DATA:  81 year old male with history of trauma from a fall 10 days ago. Headache. Unable to lift right arm. Patient takes the Xarelto for history of DVT.  EXAM: CT HEAD WITHOUT CONTRAST  TECHNIQUE: Contiguous axial images were obtained from the base of the skull through the vertex without intravenous contrast.  COMPARISON:  Head CT 10/23/2012.  FINDINGS: Moderate cerebral and mild cerebellar atrophy. Patchy and confluent areas of decreased attenuation are noted throughout the deep and periventricular white matter of the cerebral  hemispheres bilaterally, compatible with chronic microvascular ischemic disease. No acute displaced skull fractures are identified. No acute intracranial abnormality. Specifically, no evidence of acute post-traumatic intracranial hemorrhage, no definite regions of acute/subacute cerebral ischemia, no focal mass, mass effect, hydrocephalus or abnormal intra or extra-axial fluid collections. The visualized paranasal sinuses and mastoids are well pneumatized.  IMPRESSION: 1. No signs of significant acute traumatic injury to the skull or brain. 2. Moderate cerebral and mild cerebellar atrophy with extensive chronic microvascular ischemic changes in the cerebral white matter redemonstrated, as above.   Electronically Signed   By: Vinnie Langton M.D.   On: 05/30/2015 14:41   Ct Cervical Spine Wo Contrast  05/30/2015   CLINICAL DATA:  81 year old male with history of trauma from a fall 10 days ago complaining of neck pain. Patient takes Xarelto for DVT.  EXAM: CT CERVICAL SPINE WITHOUT CONTRAST  TECHNIQUE: Multidetector CT imaging of the cervical spine was performed without intravenous contrast. Multiplanar CT image reconstructions were also generated.  COMPARISON:  No priors.  FINDINGS: No acute displaced fracture of the cervical spine. Alignment is anatomic. Prevertebral soft tissues are normal. Multilevel degenerative disc disease, most severe at C5-C6 and T1-T2. Multilevel facet arthropathy. Visualized portions of the upper thorax are unremarkable.  IMPRESSION: 1. No signs of significant acute traumatic injury to the cervical spine. 2. Multilevel degenerative disc disease and cervical spondylosis, as above.   Electronically Signed   By: Vinnie Langton M.D.   On: 05/30/2015 15:20   Dg Humerus Right  05/30/2015   CLINICAL DATA:  81 year old male status post fall with right arm pain. Initial encounter.  EXAM: RIGHT HUMERUS - 2+ VIEW  COMPARISON:  Right elbow series and shoulder series from today reported  separately.  FINDINGS: Grossly normal alignment about the right shoulder and elbow. Osteopenia. No acute right humerus fracture. Visible right ribs also appear intact.  IMPRESSION: No acute fracture or dislocation identified about the right humerus.   Electronically Signed   By: Genevie Ann M.D.   On: 05/30/2015 15:35    EKG: Independently reviewed. Sinus rhythm left bundle branch block with nonspecific T-wave changes.  Assessment/Plan AKI (acute kidney injury): -He relates decreased oral intake use of lisinopril. I'll go ahead and hold his lisinopril start on gentle IV fluid hydration and recheck a basic metabolic  panel in the morning. - Check orthostatics. - Check urinary sodium and urinary creatinine. - Monitor strict I's and O's.  Essential hypertension - Continue metoprolol and hold lisinopril. Monitor.  Elevated troponin: - He denies any chest pain or shortness of breath and his EKG is unchanged from previous will continue cycles cardiac enzymes check a 2-D echo.    Possible shingles: - start acyclovir IV.   Code Status: full DVT Prophylaxis:xarelto Family Communication: none Disposition Plan: observation  Time spent: 65 min  Charlynne Cousins [facility] Pager [phone]

## 2015-05-30 NOTE — ED Notes (Signed)
Report given to [facility] RN

## 2015-05-31 ENCOUNTER — Observation Stay (HOSPITAL_COMMUNITY): Payer: Medicare Other

## 2015-05-31 DIAGNOSIS — R509 Fever, unspecified: Secondary | ICD-10-CM | POA: Diagnosis not present

## 2015-05-31 DIAGNOSIS — I82409 Acute embolism and thrombosis of unspecified deep veins of unspecified lower extremity: Secondary | ICD-10-CM | POA: Diagnosis not present

## 2015-05-31 DIAGNOSIS — Z79899 Other long term (current) drug therapy: Secondary | ICD-10-CM | POA: Diagnosis not present

## 2015-05-31 DIAGNOSIS — K219 Gastro-esophageal reflux disease without esophagitis: Secondary | ICD-10-CM | POA: Diagnosis present

## 2015-05-31 DIAGNOSIS — R079 Chest pain, unspecified: Secondary | ICD-10-CM

## 2015-05-31 DIAGNOSIS — R410 Disorientation, unspecified: Secondary | ICD-10-CM | POA: Diagnosis not present

## 2015-05-31 DIAGNOSIS — N179 Acute kidney failure, unspecified: Secondary | ICD-10-CM | POA: Diagnosis not present

## 2015-05-31 DIAGNOSIS — I10 Essential (primary) hypertension: Secondary | ICD-10-CM | POA: Diagnosis not present

## 2015-05-31 DIAGNOSIS — L089 Local infection of the skin and subcutaneous tissue, unspecified: Secondary | ICD-10-CM | POA: Diagnosis present

## 2015-05-31 DIAGNOSIS — E871 Hypo-osmolality and hyponatremia: Secondary | ICD-10-CM | POA: Diagnosis present

## 2015-05-31 DIAGNOSIS — E860 Dehydration: Secondary | ICD-10-CM | POA: Diagnosis present

## 2015-05-31 DIAGNOSIS — M25511 Pain in right shoulder: Secondary | ICD-10-CM | POA: Diagnosis not present

## 2015-05-31 DIAGNOSIS — R531 Weakness: Secondary | ICD-10-CM | POA: Diagnosis not present

## 2015-05-31 DIAGNOSIS — Z7952 Long term (current) use of systemic steroids: Secondary | ICD-10-CM | POA: Diagnosis not present

## 2015-05-31 DIAGNOSIS — Z86718 Personal history of other venous thrombosis and embolism: Secondary | ICD-10-CM | POA: Diagnosis not present

## 2015-05-31 DIAGNOSIS — B029 Zoster without complications: Secondary | ICD-10-CM | POA: Diagnosis not present

## 2015-05-31 DIAGNOSIS — Z23 Encounter for immunization: Secondary | ICD-10-CM | POA: Diagnosis not present

## 2015-05-31 DIAGNOSIS — I429 Cardiomyopathy, unspecified: Secondary | ICD-10-CM | POA: Diagnosis not present

## 2015-05-31 DIAGNOSIS — Z9181 History of falling: Secondary | ICD-10-CM | POA: Diagnosis not present

## 2015-05-31 DIAGNOSIS — Z7901 Long term (current) use of anticoagulants: Secondary | ICD-10-CM | POA: Diagnosis not present

## 2015-05-31 DIAGNOSIS — R748 Abnormal levels of other serum enzymes: Secondary | ICD-10-CM | POA: Diagnosis present

## 2015-05-31 DIAGNOSIS — E41 Nutritional marasmus: Secondary | ICD-10-CM | POA: Diagnosis not present

## 2015-05-31 DIAGNOSIS — I2510 Atherosclerotic heart disease of native coronary artery without angina pectoris: Secondary | ICD-10-CM | POA: Diagnosis present

## 2015-05-31 DIAGNOSIS — I5022 Chronic systolic (congestive) heart failure: Secondary | ICD-10-CM | POA: Diagnosis present

## 2015-05-31 DIAGNOSIS — Z6823 Body mass index (BMI) 23.0-23.9, adult: Secondary | ICD-10-CM | POA: Diagnosis not present

## 2015-05-31 DIAGNOSIS — I447 Left bundle-branch block, unspecified: Secondary | ICD-10-CM | POA: Diagnosis present

## 2015-05-31 DIAGNOSIS — R7989 Other specified abnormal findings of blood chemistry: Secondary | ICD-10-CM | POA: Diagnosis not present

## 2015-05-31 DIAGNOSIS — N189 Chronic kidney disease, unspecified: Secondary | ICD-10-CM | POA: Diagnosis not present

## 2015-05-31 DIAGNOSIS — D509 Iron deficiency anemia, unspecified: Secondary | ICD-10-CM | POA: Diagnosis present

## 2015-05-31 DIAGNOSIS — E43 Unspecified severe protein-calorie malnutrition: Secondary | ICD-10-CM | POA: Diagnosis present

## 2015-05-31 DIAGNOSIS — W19XXXA Unspecified fall, initial encounter: Secondary | ICD-10-CM | POA: Diagnosis present

## 2015-05-31 DIAGNOSIS — E780 Pure hypercholesterolemia: Secondary | ICD-10-CM | POA: Diagnosis present

## 2015-05-31 DIAGNOSIS — E876 Hypokalemia: Secondary | ICD-10-CM | POA: Diagnosis not present

## 2015-05-31 LAB — BASIC METABOLIC PANEL
Anion gap: 8 (ref 5–15)
BUN: 20 mg/dL (ref 6–20)
CHLORIDE: 101 mmol/L (ref 101–111)
CO2: 21 mmol/L — ABNORMAL LOW (ref 22–32)
Calcium: 8.2 mg/dL — ABNORMAL LOW (ref 8.9–10.3)
Creatinine, Ser: 1.19 mg/dL (ref 0.61–1.24)
GFR calc Af Amer: >60 mL/min (ref >60)
GFR, EST NON AFRICAN AMERICAN: 53 mL/min — AB (ref >60)
Glucose, Bld: 120 mg/dL — ABNORMAL HIGH (ref 65–99)
POTASSIUM: 3.1 mmol/L — AB (ref 3.5–5.1)
Sodium: 130 mmol/L — ABNORMAL LOW (ref 135–145)

## 2015-05-31 LAB — CBC
HCT: 30 % — ABNORMAL LOW (ref 39.0–52.0)
HEMOGLOBIN: 10.3 g/dL — AB (ref 13.0–17.0)
MCH: 29.4 pg (ref 26.0–34.0)
MCHC: 34.3 g/dL (ref 30.0–36.0)
MCV: 85.7 fL (ref 78.0–100.0)
Platelets: 208 10*3/uL (ref 150–400)
RBC: 3.5 MIL/uL — AB (ref 4.22–5.81)
RDW: 13.6 % (ref 11.5–15.5)
WBC: 12.8 10*3/uL — ABNORMAL HIGH (ref 4.0–10.5)

## 2015-05-31 LAB — CREATININE, URINE, RANDOM: Creatinine, Urine: 181.65 mg/dL

## 2015-05-31 LAB — SODIUM, URINE, RANDOM: SODIUM UR: 38 mmol/L

## 2015-05-31 LAB — MAGNESIUM: Magnesium: 2 mg/dL (ref 1.7–2.4)

## 2015-05-31 LAB — TROPONIN I
TROPONIN I: 0.1 ng/mL — AB (ref <0.031)
TROPONIN I: 0.08 ng/mL — AB (ref <0.031)

## 2015-05-31 MED ORDER — POTASSIUM CHLORIDE CRYS ER 20 MEQ PO TBCR
40.0000 meq | EXTENDED_RELEASE_TABLET | Freq: Once | ORAL | Status: AC
  Administered 2015-05-31: 40 meq via ORAL
  Filled 2015-05-31: qty 2

## 2015-05-31 MED ORDER — TRAMADOL-ACETAMINOPHEN 37.5-325 MG PO TABS: 1.0000 | ORAL_TABLET | Freq: Four times a day (QID) | ORAL | Status: DC | PRN

## 2015-05-31 NOTE — Evaluation (Signed)
Occupational Therapy Evaluation Patient Details Name: Jared Kim MRN: [id number] DOB: July 05, 1927 Today's Date: 05/31/2015    History of Present Illness 81 year old male with history of essential hypertension, DVT on Xarelto (unknown duration), coronary artery disease, GERD, who [place] which was mechanical in nature and did not lose consciousness. He sustained injury to his left arm and shoulder. Since then his family noted patient had poor appetite for by mouth intake and completing of right arm pain. No history of fever, chills, nausea, vomiting, chest pain, palpitations or shortness of breath, abdominal pain, bowel or urinary symptoms. Patient also having a rash on his right cheek and nose consistent with shingles..   Clinical Impression   Pt admitted with above. Pt independent with ADLs, PTA. Feel pt will benefit from acute OT to increase independence prior to d/c. Recommending HHOT as long as family can provide adequate assist for pt at home. If not, may have to consider SNF.    Follow Up Recommendations  Home health OT;Supervision/Assistance - 24 hour    Equipment Recommendations  Other (comment) (TBD)    Recommendations for Other Services       Precautions / Restrictions Precautions Precautions: Fall Restrictions Weight Bearing Restrictions: No      Mobility Bed Mobility Overal bed mobility: Needs Assistance Bed Mobility: Supine to Sit;Sit to Supine     Supine to sit: Mod assist Sit to supine: Supervision   General bed mobility comments: assist to scoot HOB and trendelenburg position used. assist with trunk to go from supine to sitting position.  Transfers Overall transfer level: Needs assistance   Transfers: Sit to/from Stand Sit to Stand: Mod assist         General transfer comment: assist to boost from bed.    Balance      Assist for balance sitting on bed managing socks.                                       ADL Overall ADL's : Needs assistance/impaired                 Upper Body Dressing : Sitting;Minimal assistance;Moderate assistance   Lower Body Dressing: Sit to/from stand;Moderate assistance   Toilet Transfer: Moderate assistance (sit to stand from bed)             General ADL Comments: Explained there is AE to assist with LB dressing.     Vision     Perception     Praxis      Pertinent Vitals/Pain Pain Assessment: 0-10 Pain Score:  (9 in right arm; 6-right knee; 4-left knee) Pain Intervention(s): Limited activity within patient's tolerance;Monitored during session     Hand Dominance     Extremity/Trunk Assessment Upper Extremity Assessment Upper Extremity Assessment: RUE deficits/detail;Generalized weakness RUE Deficits / Details: limited AROM shoulder flexion due to pain   Lower Extremity Assessment Lower Extremity Assessment: Defer to PT evaluation       Communication Communication Communication: No difficulties   Cognition Arousal/Alertness: Awake/alert Behavior During Therapy: WFL for tasks assessed/performed Overall Cognitive Status: No family/caregiver present to determine baseline cognitive functioning (not oriented to specific place or year)                     General Comments       Exercises  Shoulder Instructions      Home Living Family/patient expects to be discharged to:: Private residence Living Arrangements: Children Available Help at Discharge: Family (per pt, she is there most of the time) Type of Home: House Home Access: Stairs to enter [employer] of Steps: 6-7 Entrance Stairs-Rails: Right;Left;Can reach both Home Layout: One level     Bathroom Shower/Tub: Occupational psychologist: Handicapped height (cabinet near one toilet and grab bar beside other)     Home Equipment: Shower seat - built in;Cane - single point;Grab bars - toilet          Prior Functioning/Environment  Level of Independence: Independent with assistive device(s)             OT Diagnosis: Generalized weakness;Acute pain   OT Problem List: Decreased strength;Decreased range of motion;Impaired balance (sitting and/or standing);Decreased cognition;Pain;Impaired UE functional use;Decreased knowledge of precautions;Decreased knowledge of use of DME or AE;Decreased activity tolerance   OT Treatment/Interventions: Self-care/ADL training;DME and/or AE instruction;Therapeutic exercise;Therapeutic activities;Cognitive remediation/compensation;Balance training;Patient/family education    OT Goals(Current goals can be found in the care plan section) Acute Rehab OT Goals Patient Stated Goal: not stated OT Goal Formulation: With patient Time For Goal Achievement: 06/07/15 Potential to Achieve Goals: Good ADL Goals Pt Will Perform Lower Body Dressing: with set-up;with supervision;sit to/from stand;with adaptive equipment Pt Will Transfer to Toilet: with min guard assist;ambulating;grab bars (elevated toilet) Pt Will Perform Toileting - Clothing Manipulation and hygiene: sit to/from stand;with set-up;with supervision  OT Frequency: Min 2X/week   Barriers to D/C:            Co-evaluation              End of Session Nurse Communication: Mobility status;Other (comment) (need walker in room; discharge on pad; d/c recommendation)  Activity Tolerance: Patient tolerated treatment well Patient left: in bed;with call bell/phone within reach;with bed alarm set   Time: 1102-1117 OT Time Calculation (min): 16 min Charges:  OT General Charges $OT Visit: 1 Procedure OT Evaluation $Initial OT Evaluation Tier I: 1 Procedure G-CodesBenito Mccreedy OTR/L [id number] 05/31/2015, 5:21 PM

## 2015-05-31 NOTE — Progress Notes (Signed)
PT Cancellation Note  Patient Details Name: Jared Kim MRN: [id number] DOB: [birth year]   Cancelled Treatment:    Reason Eval/Treat Not Completed: Fatigue/lethargy limiting ability to participate.  Patient reports "It's too busy today. Tomorrow would be a better day".  Provided encouragement to participate - patient continued to decline PT.  Will return tomorrow for PT evaluation.   Despina Pole 05/31/2015, 2:07 PM Carita Pian. Sanjuana Kava, [place] Pager [phone]

## 2015-05-31 NOTE — Progress Notes (Signed)
Echocardiogram 2D Echocardiogram has been performed.  Joelene Millin 05/31/2015, 1:39 PM

## 2015-05-31 NOTE — Progress Notes (Addendum)
[facility] PROGRESS NOTE  Jared Kim ZOX:[id number] DOB: Dec 03, 1926 DOA: 05/30/2015 PCP: Vidal Schwalbe, MD  Brief narrative 81 year old male with history of essential hypertension, DVT on Xarelto (unknown duration), coronary artery disease, GERD, [place] which was mechanical in nature and did not lose consciousness. He sustained injury to his left arm and shoulder. Since then his family noted patient had poor appetite for by mouth intake and completing of right arm pain. No history of fever, chills, nausea, vomiting, chest pain, palpitations or shortness of breath, abdominal pain, bowel or urinary symptoms. Patient also having a rash on his right cheek and nose consistent with shingles.. In the ED vitals were stable. Blood work showed  leukocytosis, hemoglobin of 11.4, mild hyponatremia with sodium of 130, hypokalemia (2.1) acute kidney injury with creatinine 1.5 and mildly elevated troponin. Patient admitted to telemetry.  Assessment/Plan: Acute kidney injury Appears to be prerenal with poor by mouth intake for past 10 days. Patient also taking lisinopril. His lisinopril has been held and started on gentle IV hydration. Renal function improved in the a.m. lab. UA negative for UTI.  Hyponatremia/hypokalemia Continue gentle hydration. Replenish low potassium. Check magnesium level.  Dehydration and generalized weakness PT evaluation. X-ray of the left arm and shoulder without any fracture or acute injury. Pain control with when necessary Ultracet.  Elevated troponin level Troponin peaked 0.10. EKG shows LBBB (last EKG was 3 years ago showing A. fib). No chest pain, shortness of breath or palpitation. Possibly demand ischemia.check 2-D echo.  Essential hypertension Blood pressure stable. Holding lisinopril.  History of DVT On Xarelto which has been continued. No clear duration of anticoagulation.  Shingles on the face Started on IV  acyclovir.  CAD Continue Lipitor and metoprolol.  Anemia Possibly iron deficiency. Is on iron supplements at home. Hemoglobin appears at baseline from 2 years ago.  Diet: Heart healthy DVT prophylaxis: On Xarelto  Code Status: Full code Family Communication: Update daughter on the phone Disposition Plan: PT evaluation pending. Possible discharge on 8/15   Consultants:  None  Procedures:  2-D echo  Antibiotics:  None  HPI/Subjective: Patient seen and examined. Reports feeling better today. Complains of right arm pain. Denies dizziness.  Objective: Filed Vitals:   05/31/15 0430  BP: 132/61  Pulse: 72  Temp: 98.3 F (36.8 C)  Resp: 17    Intake/Output Summary (Last 24 hours) at 05/31/15 1109 Last data filed at 05/31/15 1030  Gross per 24 hour  Intake    360 ml  Output      2 ml  Net    358 ml   Filed Weights   05/30/15 2057 05/31/15 0430  Weight: 80.241 kg (176 lb 14.4 oz) 80.06 kg (176 lb 8 oz)    Exam:   General:  Elderly male in no acute distress  HEENT: Shingles lower right cheek and nose, no pallor, moist oral mucosa  Chest: Clear to auscultation bilaterally  CVS: S1 and S2 regular, no murmurs rub or gallop  GI: Soft, nondistended, nontender, bowel sounds present  Musculoskeletal: Tender range of motion of right shoulder and arm.  Chronic skin changes and varicose veins, no edema  CNS: Alert and oriented   Data Reviewed: Basic Metabolic Panel:  Recent Labs Lab 05/30/15 1330 05/31/15 0258  NA 130* 130*  K 3.7 3.1*  CL 97* 101  CO2 22 21*  GLUCOSE 118* 120*  BUN 26* 20  CREATININE 1.50* 1.19  CALCIUM 9.0  8.2*   Liver Function Tests:  Recent Labs Lab 05/30/15 1330  AST 42*  ALT 21  ALKPHOS 96  BILITOT 1.1  PROT 7.4  ALBUMIN 2.8*   No results for input(s): LIPASE, AMYLASE in the last 168 hours. No results for input(s): AMMONIA in the last 168 hours. CBC:  Recent Labs Lab 05/30/15 1330 05/31/15 0258  WBC 13.5*  12.8*  HGB 11.4* 10.3*  HCT 32.9* 30.0*  MCV 85.2 85.7  PLT 246 208   Cardiac Enzymes:  Recent Labs Lab 05/30/15 1330 05/30/15 1703 05/30/15 2132 05/31/15 0258 05/31/15 0946  TROPONINI 0.04* 0.06* 0.09* 0.10* 0.08*   BNP (last 3 results) No results for input(s): BNP in the last 8760 hours.  ProBNP (last 3 results) No results for input(s): PROBNP in the last 8760 hours.  CBG: No results for input(s): GLUCAP in the last 168 hours.  No results found for this or any previous visit (from the past 240 hour(s)).   Studies: Dg Shoulder Right  05/30/2015   CLINICAL DATA:  81 year old male with history of fall 10 days ago complaining of pain in the right shoulder unable to lift the right arm.  EXAM: RIGHT SHOULDER - 2+ VIEW  COMPARISON:  No priors.  FINDINGS: Large subacromial spur. Degenerative changes of osteoarthritis at the acromioclavicular and glenohumeral joints. No acute displaced fracture, subluxation or dislocation.  IMPRESSION: 1. No acute radiographic abnormality of the right shoulder. 2. Degenerative changes of osteoarthritis in the glenohumeral and acromioclavicular joints. 3. Large subacromial spur.   Electronically Signed   By: Vinnie Langton M.D.   On: 05/30/2015 14:33   Dg Elbow Complete Right  05/30/2015   CLINICAL DATA:  Status post fall 10 days ago.  Right arm pain.  EXAM: RIGHT ELBOW - COMPLETE 3+ VIEW  COMPARISON:  None.  FINDINGS: Small calcification along the right radial head. There is no evidence of fracture, dislocation, or joint effusion. There is no evidence of arthropathy or other focal bone abnormality. Soft tissues are unremarkable.  IMPRESSION: 1. No acute fracture or dislocation of the right elbow. 2. Calcification along the right radial head which may be within the soft tissues and are likely dystrophic. The appearance is atypical for an avulsive injury.   Electronically Signed   By: Kathreen Devoid   On: 05/30/2015 14:45   Dg Forearm Right  05/30/2015    CLINICAL DATA:  Fall, pain  EXAM: RIGHT FOREARM - 2 VIEW  COMPARISON:  Right elbow same day  FINDINGS: Two views of the right forearm submitted. No acute fracture or subluxation. No radiopaque foreign body.  IMPRESSION: Negative.   Electronically Signed   By: Lahoma Crocker M.D.   On: 05/30/2015 15:33   Ct Head Wo Contrast  05/30/2015   CLINICAL DATA:  81 year old male with history of trauma from a fall 10 days ago. Headache. Unable to lift right arm. Patient takes the Xarelto for history of DVT.  EXAM: CT HEAD WITHOUT CONTRAST  TECHNIQUE: Contiguous axial images were obtained from the base of the skull through the vertex without intravenous contrast.  COMPARISON:  Head CT 10/23/2012.  FINDINGS: Moderate cerebral and mild cerebellar atrophy. Patchy and confluent areas of decreased attenuation are noted throughout the deep and periventricular white matter of the cerebral hemispheres bilaterally, compatible with chronic microvascular ischemic disease. No acute displaced skull fractures are identified. No acute intracranial abnormality. Specifically, no evidence of acute post-traumatic intracranial hemorrhage, no definite regions of acute/subacute cerebral ischemia, no focal mass, mass  effect, hydrocephalus or abnormal intra or extra-axial fluid collections. The visualized paranasal sinuses and mastoids are well pneumatized.  IMPRESSION: 1. No signs of significant acute traumatic injury to the skull or brain. 2. Moderate cerebral and mild cerebellar atrophy with extensive chronic microvascular ischemic changes in the cerebral white matter redemonstrated, as above.   Electronically Signed   By: Vinnie Langton M.D.   On: 05/30/2015 14:41   Ct Cervical Spine Wo Contrast  05/30/2015   CLINICAL DATA:  81 year old male with history of trauma from a fall 10 days ago complaining of neck pain. Patient takes Xarelto for DVT.  EXAM: CT CERVICAL SPINE WITHOUT CONTRAST  TECHNIQUE: Multidetector CT imaging of the cervical spine  was performed without intravenous contrast. Multiplanar CT image reconstructions were also generated.  COMPARISON:  No priors.  FINDINGS: No acute displaced fracture of the cervical spine. Alignment is anatomic. Prevertebral soft tissues are normal. Multilevel degenerative disc disease, most severe at C5-C6 and T1-T2. Multilevel facet arthropathy. Visualized portions of the upper thorax are unremarkable.  IMPRESSION: 1. No signs of significant acute traumatic injury to the cervical spine. 2. Multilevel degenerative disc disease and cervical spondylosis, as above.   Electronically Signed   By: Vinnie Langton M.D.   On: 05/30/2015 15:20   Dg Humerus Right  05/30/2015   CLINICAL DATA:  81 year old male status post fall with right arm pain. Initial encounter.  EXAM: RIGHT HUMERUS - 2+ VIEW  COMPARISON:  Right elbow series and shoulder series from today reported separately.  FINDINGS: Grossly normal alignment about the right shoulder and elbow. Osteopenia. No acute right humerus fracture. Visible right ribs also appear intact.  IMPRESSION: No acute fracture or dislocation identified about the right humerus.   Electronically Signed   By: Genevie Ann M.D.   On: 05/30/2015 15:35    Scheduled Meds: . acyclovir  10 mg/kg (Ideal) Intravenous Q12H  . atorvastatin  40 mg Oral QHS  . docusate sodium  100 mg Oral Daily  . ferrous sulfate  325 mg Oral Daily  . metoprolol succinate  12.5 mg Oral Daily  . montelukast  10 mg Oral Daily  . rivaroxaban  20 mg Oral Q supper  . sodium chloride  3 mL Intravenous Q12H   Continuous Infusions: . sodium chloride 50 mL/hr at 05/30/15 2100      Time spent: 25 minutes    Louellen Molder  [facility] Pager [phone] If 7PM-7AM, please contact night-coverage at [email or web address], password [facility] 05/31/2015, 11:09 AM

## 2015-06-01 DIAGNOSIS — I429 Cardiomyopathy, unspecified: Secondary | ICD-10-CM

## 2015-06-01 DIAGNOSIS — E41 Nutritional marasmus: Secondary | ICD-10-CM

## 2015-06-01 DIAGNOSIS — E43 Unspecified severe protein-calorie malnutrition: Secondary | ICD-10-CM | POA: Diagnosis present

## 2015-06-01 LAB — BASIC METABOLIC PANEL
Anion gap: 7 (ref 5–15)
BUN: 11 mg/dL (ref 6–20)
CALCIUM: 8.5 mg/dL — AB (ref 8.9–10.3)
CHLORIDE: 99 mmol/L — AB (ref 101–111)
CO2: 24 mmol/L (ref 22–32)
CREATININE: 0.97 mg/dL (ref 0.61–1.24)
GFR calc non Af Amer: >60 mL/min (ref >60)
GLUCOSE: 111 mg/dL — AB (ref 65–99)
Potassium: 4 mmol/L (ref 3.5–5.1)
Sodium: 130 mmol/L — ABNORMAL LOW (ref 135–145)

## 2015-06-01 MED ORDER — ENSURE ENLIVE PO LIQD
237.0000 mL | Freq: Two times a day (BID) | ORAL | Status: DC
  Administered 2015-06-01 – 2015-06-03 (×3): 237 mL via ORAL

## 2015-06-01 MED ORDER — VALACYCLOVIR HCL 500 MG PO TABS
1000.0000 mg | ORAL_TABLET | Freq: Two times a day (BID) | ORAL | Status: DC
  Administered 2015-06-01 – 2015-06-03 (×4): 1000 mg via ORAL
  Filled 2015-06-01 (×4): qty 2

## 2015-06-01 NOTE — Progress Notes (Signed)
Per patient's daughter [name]- prefers [facility] or [facility] if possible. Referrals have been sent to [facility] and [facility].  CSW spoke to admissions at [facility] earlier today- she will watch for the referral as she states she has some male bed vacancies at this time.  Could accept tomorrow if medically stable per MD.  Butch Penny T. Pauline Good, [place]

## 2015-06-01 NOTE — Progress Notes (Signed)
[facility] PROGRESS NOTE  Jared Kim BHA:[id number] DOB: 04-Nov-1926 DOA: 05/30/2015 PCP: Vidal Schwalbe, MD  Brief narrative 81 year old male with history of essential hypertension, DVT on Xarelto (unknown duration), coronary artery disease, GERD, [place] which was mechanical in nature and did not lose consciousness. He sustained injury to his left arm and shoulder. Since then his family noted patient had poor appetite for by mouth intake and completing of right arm pain. No history of fever, chills, nausea, vomiting, chest pain, palpitations or shortness of breath, abdominal pain, bowel or urinary symptoms. Patient also having a rash on his right cheek and nose consistent with shingles.. In the ED vitals were stable. Blood work showed  leukocytosis, hemoglobin of 11.4, mild hyponatremia with sodium of 130, hypokalemia (2.1) acute kidney injury with creatinine 1.5 and mildly elevated troponin. Patient admitted to telemetry.  Assessment/Plan: Acute kidney injury Appears to be prerenal with poor by mouth intake for past 10 days. Patient also taking lisinopril. His lisinopril has been held and received gentle IV hydration. Renal function has improved to baseline. UA negative for UTI.  Hyponatremia/hypokalemia Continue gentle hydration. Replenished low potassium.  Dehydration and generalized weakness PT evaluation. X-ray of the right arm and shoulder without any fracture or acute injury. Pain control with when necessary Ultracet. Patient able to move his arm better today.  Elevated troponin level history of cardiomyopathy Troponin peaked 0.10. EKG shows LBBB . No chest pain, shortness of breath or palpitation. Possibly demand ischemia.has underlying cardiomyopathy with EF of 30-35%. Cardiology consulted. Patient apparently had 2-D echo done at [facility] in March with similar EF and recommend this to be chronic and no further cardiac workup recommended. On  low-dose beta blocker.    Essential hypertension Blood pressure stable. Holding lisinopril.  History of DVT On Xarelto which has been continued. No clear duration of anticoagulation.  Shingles on the face Switched to Valtrex.  CAD Continue Lipitor and metoprolol.  Anemia Possibly iron deficiency. Is on iron supplements at home. Hemoglobin appears at baseline from 2 years ago.  Severe malnutrition. Appreciate nutrition evaluation and added supplements.  Diet: Heart healthy DVT prophylaxis: On Xarelto  Code Status: Full code Family Communication: UpdateD daughter on the phone Disposition Plan: PT evaluation recommends SNF.  Possible discharge on 8/17 .    Consultants:  None  Procedures:  2-D echo  Antibiotics:  None  HPI/Subjective: Patient seen and examined. Reports feeling better today. Complains of right arm pain. Denies dizziness.  Objective: Filed Vitals:   06/01/15 0500  BP: 133/63  Pulse: 85  Temp: 98.8 F (37.1 C)  Resp:     Intake/Output Summary (Last 24 hours) at 06/01/15 1222 Last data filed at 06/01/15 0900  Gross per 24 hour  Intake   1338 ml  Output    575 ml  Net    763 ml   Filed Weights   05/30/15 2057 05/31/15 0430 06/01/15 0500  Weight: 80.241 kg (176 lb 14.4 oz) 80.06 kg (176 lb 8 oz) 81.557 kg (179 lb 12.8 oz)    Exam:   General:   no acute distress  HEENT: Shingles lower right cheek and nose, moist oral mucosa  Chest: Clear to auscultation bilaterally  CVS: S1 and S2 regular, no murmurs rub or gallop  GI: Soft, nondistended, nontender, bowel sounds present  Musculoskeletal: Limited R Lamb of right shoulder (Improved since yesterday)  Chronic skin changes and varicose veins, no edema  CNS: Alert  and oriented   Data Reviewed: Basic Metabolic Panel:  Recent Labs Lab 05/30/15 1330 05/31/15 0258 05/31/15 0946  NA 130* 130*  --   K 3.7 3.1*  --   CL 97* 101  --   CO2 22 21*  --   GLUCOSE 118* 120*  --    BUN 26* 20  --   CREATININE 1.50* 1.19  --   CALCIUM 9.0 8.2*  --   MG  --   --  2.0   Liver Function Tests:  Recent Labs Lab 05/30/15 1330  AST 42*  ALT 21  ALKPHOS 96  BILITOT 1.1  PROT 7.4  ALBUMIN 2.8*   No results for input(s): LIPASE, AMYLASE in the last 168 hours. No results for input(s): AMMONIA in the last 168 hours. CBC:  Recent Labs Lab 05/30/15 1330 05/31/15 0258  WBC 13.5* 12.8*  HGB 11.4* 10.3*  HCT 32.9* 30.0*  MCV 85.2 85.7  PLT 246 208   Cardiac Enzymes:  Recent Labs Lab 05/30/15 1330 05/30/15 1703 05/30/15 2132 05/31/15 0258 05/31/15 0946  TROPONINI 0.04* 0.06* 0.09* 0.10* 0.08*   BNP (last 3 results) No results for input(s): BNP in the last 8760 hours.  ProBNP (last 3 results) No results for input(s): PROBNP in the last 8760 hours.  CBG: No results for input(s): GLUCAP in the last 168 hours.  No results found for this or any previous visit (from the past 240 hour(s)).   Studies: Dg Shoulder Right  05/30/2015   CLINICAL DATA:  81 year old male with history of fall 10 days ago complaining of pain in the right shoulder unable to lift the right arm.  EXAM: RIGHT SHOULDER - 2+ VIEW  COMPARISON:  No priors.  FINDINGS: Large subacromial spur. Degenerative changes of osteoarthritis at the acromioclavicular and glenohumeral joints. No acute displaced fracture, subluxation or dislocation.  IMPRESSION: 1. No acute radiographic abnormality of the right shoulder. 2. Degenerative changes of osteoarthritis in the glenohumeral and acromioclavicular joints. 3. Large subacromial spur.   Electronically Signed   By: Vinnie Langton M.D.   On: 05/30/2015 14:33   Dg Elbow Complete Right  05/30/2015   CLINICAL DATA:  Status post fall 10 days ago.  Right arm pain.  EXAM: RIGHT ELBOW - COMPLETE 3+ VIEW  COMPARISON:  None.  FINDINGS: Small calcification along the right radial head. There is no evidence of fracture, dislocation, or joint effusion. There is no  evidence of arthropathy or other focal bone abnormality. Soft tissues are unremarkable.  IMPRESSION: 1. No acute fracture or dislocation of the right elbow. 2. Calcification along the right radial head which may be within the soft tissues and are likely dystrophic. The appearance is atypical for an avulsive injury.   Electronically Signed   By: Kathreen Devoid   On: 05/30/2015 14:45   Dg Forearm Right  05/30/2015   CLINICAL DATA:  Fall, pain  EXAM: RIGHT FOREARM - 2 VIEW  COMPARISON:  Right elbow same day  FINDINGS: Two views of the right forearm submitted. No acute fracture or subluxation. No radiopaque foreign body.  IMPRESSION: Negative.   Electronically Signed   By: Lahoma Crocker M.D.   On: 05/30/2015 15:33   Ct Head Wo Contrast  05/30/2015   CLINICAL DATA:  81 year old male with history of trauma from a fall 10 days ago. Headache. Unable to lift right arm. Patient takes the Xarelto for history of DVT.  EXAM: CT HEAD WITHOUT CONTRAST  TECHNIQUE: Contiguous axial images were obtained  from the base of the skull through the vertex without intravenous contrast.  COMPARISON:  Head CT 10/23/2012.  FINDINGS: Moderate cerebral and mild cerebellar atrophy. Patchy and confluent areas of decreased attenuation are noted throughout the deep and periventricular white matter of the cerebral hemispheres bilaterally, compatible with chronic microvascular ischemic disease. No acute displaced skull fractures are identified. No acute intracranial abnormality. Specifically, no evidence of acute post-traumatic intracranial hemorrhage, no definite regions of acute/subacute cerebral ischemia, no focal mass, mass effect, hydrocephalus or abnormal intra or extra-axial fluid collections. The visualized paranasal sinuses and mastoids are well pneumatized.  IMPRESSION: 1. No signs of significant acute traumatic injury to the skull or brain. 2. Moderate cerebral and mild cerebellar atrophy with extensive chronic microvascular ischemic  changes in the cerebral white matter redemonstrated, as above.   Electronically Signed   By: Vinnie Langton M.D.   On: 05/30/2015 14:41   Ct Cervical Spine Wo Contrast  05/30/2015   CLINICAL DATA:  81 year old male with history of trauma from a fall 10 days ago complaining of neck pain. Patient takes Xarelto for DVT.  EXAM: CT CERVICAL SPINE WITHOUT CONTRAST  TECHNIQUE: Multidetector CT imaging of the cervical spine was performed without intravenous contrast. Multiplanar CT image reconstructions were also generated.  COMPARISON:  No priors.  FINDINGS: No acute displaced fracture of the cervical spine. Alignment is anatomic. Prevertebral soft tissues are normal. Multilevel degenerative disc disease, most severe at C5-C6 and T1-T2. Multilevel facet arthropathy. Visualized portions of the upper thorax are unremarkable.  IMPRESSION: 1. No signs of significant acute traumatic injury to the cervical spine. 2. Multilevel degenerative disc disease and cervical spondylosis, as above.   Electronically Signed   By: Vinnie Langton M.D.   On: 05/30/2015 15:20   Dg Humerus Right  05/30/2015   CLINICAL DATA:  81 year old male status post fall with right arm pain. Initial encounter.  EXAM: RIGHT HUMERUS - 2+ VIEW  COMPARISON:  Right elbow series and shoulder series from today reported separately.  FINDINGS: Grossly normal alignment about the right shoulder and elbow. Osteopenia. No acute right humerus fracture. Visible right ribs also appear intact.  IMPRESSION: No acute fracture or dislocation identified about the right humerus.   Electronically Signed   By: Genevie Ann M.D.   On: 05/30/2015 15:35    Scheduled Meds: . atorvastatin  40 mg Oral QHS  . docusate sodium  100 mg Oral Daily  . feeding supplement (ENSURE ENLIVE)  237 mL Oral BID BM  . ferrous sulfate  325 mg Oral Daily  . metoprolol succinate  12.5 mg Oral Daily  . montelukast  10 mg Oral Daily  . rivaroxaban  20 mg Oral Q supper  . sodium chloride  3 mL  Intravenous Q12H  . valACYclovir  1,000 mg Oral BID   Continuous Infusions:      Time spent: 25 minutes    Jared Kim  [facility] Pager [phone] If 7PM-7AM, please contact night-coverage at [email or web address], password [facility] 06/01/2015, 12:22 PM  LOS: 1 day

## 2015-06-01 NOTE — Consult Note (Signed)
Admit date: 05/30/2015 Referring Physician  Dr. Clementeen Graham Primary Physician Vidal Schwalbe, MD Primary Cardiologist  Dr. Sherre Lain [facility] healthcare Reason for Consultation  Decreased EF, elevated troponoin  HPI: 81 year old male with essential hypertension consulted for evaluation of decreased EF/ cardiomyopathy, elevated troponin. In review of care everywhere, there is an office visit on 05/21/15 from [facility] for chronic systolic heart failure.  His systolic function was reduced, EF of 30% with NYHA class 1-2 symptoms, previously euvolemic on low-dose Lasix 10 mg once a day as needed for shortness of breath or edema, metoprolol succinate low-dose 12.5 g once a day and lisinopril 10 mg once a day. He was made DNR/DNI. Coronary artery disease. He also has a history of DVT and has been treated on anticoagulation with Xarelto. Cardiac catheterization is listed on past procedures although I do not see results of this.  He was admitted on 05/30/15 with chief complaint of fall 2 weeks ago, denying any loss of consciousness. He noted decreased appetite for several days prior with mild frontal headache. In the emergency room he was found to be in acute renal failure with hyponatremia and hypokalemia with mildly elevated LFTs as well as mildly elevated troponin.   Rash on right cheek and nose consistent with shingles.  He is also being treated for DVT with Xarelto. Unknown duration.  EKG demonstrates left bundle branch block, troponin peaked at 0.1, no chest pain reported, no palpitations, no shortness of breath.   Lisinopril is being held secondary to acute kidney injury.  An echocardiogram was performed which demonstrated ejection fraction in the 20-25% range. Markedly reduced. This is no significant change from prior echocardiogram at [facility].   PMH:   Past Medical History  Diagnosis Date  . Coronary artery disease   . Hypertension   . High cholesterol   . GERD (gastroesophageal reflux  disease)   . DVT (deep venous thrombosis)     PSH:   Past Surgical History  Procedure Laterality Date  . Appendectomy    . Prostate surgery      Allergies:  Review of patient's allergies indicates no known allergies. Prior to Admit Meds:   Prior to Admission medications   Medication Sig Start Date End Date Taking? Authorizing Provider  atorvastatin (LIPITOR) 40 MG tablet Take 40 mg by mouth at bedtime.   Yes Historical Provider, MD  docusate sodium (COLACE) 100 MG capsule Take 100 mg by mouth daily.   Yes Historical Provider, MD  ferrous sulfate 325 (65 FE) MG tablet Take 325 mg by mouth daily.   Yes Historical Provider, MD  lisinopril (PRINIVIL,ZESTRIL) 10 MG tablet Take 10 mg by mouth daily.   Yes Historical Provider, MD  metoprolol succinate (TOPROL-XL) 25 MG 24 hr tablet Take 12.5 mg by mouth daily. 05/21/15 05/20/16 Yes Historical Provider, MD  montelukast (SINGULAIR) 10 MG tablet Take 10 mg by mouth daily.   Yes Historical Provider, MD  Tamsulosin HCl (FLOMAX) 0.4 MG CAPS Take 0.4 mg by mouth daily.   Yes Historical Provider, MD  XARELTO 20 MG TABS tablet Take 20 mg by mouth daily. 05/21/15  Yes Historical Provider, MD  acyclovir (ZOVIRAX) 400 MG tablet Take 1 tablet (400 mg total) by mouth 4 (four) times daily. 05/30/15   Lajean Saver, MD  HYDROcodone-acetaminophen (NORCO/VICODIN) 5-325 MG per tablet Take 1 tablet by mouth every 6 (six) hours as needed for moderate pain. 05/30/15   Lajean Saver, MD  predniSONE (DELTASONE) 20 MG tablet 2 po once  a day for 5 days, then 1 po once a day for 5 days 05/30/15   Lajean Saver, MD   Fam HX:    Family History  Problem Relation Age of Onset  . Cancer - Other Mother   . Heart attack Father    Social HX:    Social History   Social History  . Marital Status: Married    Spouse Name: N/A  . Number of Children: N/A  . Years of Education: N/A   Occupational History  . Not on file.   Social History Main Topics  . Smoking status: Never Smoker     . Smokeless tobacco: Never Used  . Alcohol Use: No  . Drug Use: No  . Sexual Activity: No   Other Topics Concern  . Not on file   Social History Narrative     ROS:  All 11 ROS were addressed and are negative except what is stated in the HPI   Physical Exam: Blood pressure 133/63, pulse 85, temperature 98.8 F (37.1 C), temperature source Oral, resp. rate 20, height 6\' 1"  (1.854 m), weight 179 lb 12.8 oz (81.557 kg), SpO2 96 %.   General: Well developed, well nourished, in no acute distress Head: Eyes PERRLA, No xanthomas.   Normal cephalic and atramatic. Facial rash-shingles  Lungs:   Clear bilaterally to auscultation and percussion. Normal respiratory effort. No wheezes, no rales. Heart:   HRRR S1 S2 Pulses are 2+ & equal. No murmur, rubs, gallops.  No carotid bruit. No JVD.  No abdominal bruits.  Abdomen: Bowel sounds are positive, abdomen soft and non-tender without masses. No hepatosplenomegaly. Msk:  Back normal. Normal strength and tone for age. Extremities:  No clubbing, cyanosis or edema.  DP +1 Neuro: Alert and oriented X 3, non-focal, MAE x 4 GU: Deferred Rectal: Deferred Psych:  Good affect, responds appropriately      Labs: Lab Results  Component Value Date   WBC 12.8* 05/31/2015   HGB 10.3* 05/31/2015   HCT 30.0* 05/31/2015   MCV 85.7 05/31/2015   PLT 208 05/31/2015     Recent Labs Lab 05/30/15 1330 05/31/15 0258  NA 130* 130*  K 3.7 3.1*  CL 97* 101  CO2 22 21*  BUN 26* 20  CREATININE 1.50* 1.19  CALCIUM 9.0 8.2*  PROT 7.4  --   BILITOT 1.1  --   ALKPHOS 96  --   ALT 21  --   AST 42*  --   GLUCOSE 118* 120*    Recent Labs  05/30/15 1703 05/30/15 2132 05/31/15 0258 05/31/15 0946  TROPONINI 0.06* 0.09* 0.10* 0.08*   No results found for: CHOL, HDL, LDLCALC, TRIG No results found for: DDIMER   Radiology:  Dg Shoulder Right  05/30/2015   CLINICAL DATA:  81 year old male with history of fall 10 days ago complaining of pain in the  right shoulder unable to lift the right arm.  EXAM: RIGHT SHOULDER - 2+ VIEW  COMPARISON:  No priors.  FINDINGS: Large subacromial spur. Degenerative changes of osteoarthritis at the acromioclavicular and glenohumeral joints. No acute displaced fracture, subluxation or dislocation.  IMPRESSION: 1. No acute radiographic abnormality of the right shoulder. 2. Degenerative changes of osteoarthritis in the glenohumeral and acromioclavicular joints. 3. Large subacromial spur.   Electronically Signed   By: Vinnie Langton M.D.   On: 05/30/2015 14:33   Dg Elbow Complete Right  05/30/2015   CLINICAL DATA:  Status post fall 10 days ago.  Right arm  pain.  EXAM: RIGHT ELBOW - COMPLETE 3+ VIEW  COMPARISON:  None.  FINDINGS: Small calcification along the right radial head. There is no evidence of fracture, dislocation, or joint effusion. There is no evidence of arthropathy or other focal bone abnormality. Soft tissues are unremarkable.  IMPRESSION: 1. No acute fracture or dislocation of the right elbow. 2. Calcification along the right radial head which may be within the soft tissues and are likely dystrophic. The appearance is atypical for an avulsive injury.   Electronically Signed   By: Kathreen Devoid   On: 05/30/2015 14:45   Dg Forearm Right  05/30/2015   CLINICAL DATA:  Fall, pain  EXAM: RIGHT FOREARM - 2 VIEW  COMPARISON:  Right elbow same day  FINDINGS: Two views of the right forearm submitted. No acute fracture or subluxation. No radiopaque foreign body.  IMPRESSION: Negative.   Electronically Signed   By: Lahoma Crocker M.D.   On: 05/30/2015 15:33   Ct Head Wo Contrast  05/30/2015   CLINICAL DATA:  81 year old male with history of trauma from a fall 10 days ago. Headache. Unable to lift right arm. Patient takes the Xarelto for history of DVT.  EXAM: CT HEAD WITHOUT CONTRAST  TECHNIQUE: Contiguous axial images were obtained from the base of the skull through the vertex without intravenous contrast.  COMPARISON:  Head  CT 10/23/2012.  FINDINGS: Moderate cerebral and mild cerebellar atrophy. Patchy and confluent areas of decreased attenuation are noted throughout the deep and periventricular white matter of the cerebral hemispheres bilaterally, compatible with chronic microvascular ischemic disease. No acute displaced skull fractures are identified. No acute intracranial abnormality. Specifically, no evidence of acute post-traumatic intracranial hemorrhage, no definite regions of acute/subacute cerebral ischemia, no focal mass, mass effect, hydrocephalus or abnormal intra or extra-axial fluid collections. The visualized paranasal sinuses and mastoids are well pneumatized.  IMPRESSION: 1. No signs of significant acute traumatic injury to the skull or brain. 2. Moderate cerebral and mild cerebellar atrophy with extensive chronic microvascular ischemic changes in the cerebral white matter redemonstrated, as above.   Electronically Signed   By: Vinnie Langton M.D.   On: 05/30/2015 14:41   Ct Cervical Spine Wo Contrast  05/30/2015   CLINICAL DATA:  81 year old male with history of trauma from a fall 10 days ago complaining of neck pain. Patient takes Xarelto for DVT.  EXAM: CT CERVICAL SPINE WITHOUT CONTRAST  TECHNIQUE: Multidetector CT imaging of the cervical spine was performed without intravenous contrast. Multiplanar CT image reconstructions were also generated.  COMPARISON:  No priors.  FINDINGS: No acute displaced fracture of the cervical spine. Alignment is anatomic. Prevertebral soft tissues are normal. Multilevel degenerative disc disease, most severe at C5-C6 and T1-T2. Multilevel facet arthropathy. Visualized portions of the upper thorax are unremarkable.  IMPRESSION: 1. No signs of significant acute traumatic injury to the cervical spine. 2. Multilevel degenerative disc disease and cervical spondylosis, as above.   Electronically Signed   By: Vinnie Langton M.D.   On: 05/30/2015 15:20   Dg Humerus Right  05/30/2015    CLINICAL DATA:  81 year old male status post fall with right arm pain. Initial encounter.  EXAM: RIGHT HUMERUS - 2+ VIEW  COMPARISON:  Right elbow series and shoulder series from today reported separately.  FINDINGS: Grossly normal alignment about the right shoulder and elbow. Osteopenia. No acute right humerus fracture. Visible right ribs also appear intact.  IMPRESSION: No acute fracture or dislocation identified about the right humerus.   Electronically  Signed   By: Genevie Ann M.D.   On: 05/30/2015 15:35   Personally viewed.  EKG: 05/30/15-Sinus rhythm, left bundle branch block like morphology (interventricular conduction delay) heart rate 92 bpm Personally viewed.   Echocardiogram: 05/31/15  - Left ventricle: The cavity size was at the upper limits of normal. Wall thickness was increased in a pattern of mild LVH. Systolic function was severely reduced. The estimated ejection fraction was in the range of 20% to 25%. Diffuse hypokinesis. There is severe hypokinesis of the midanteroseptal myocardium. Doppler parameters are consistent with abnormal left ventricular relaxation (grade 1 diastolic dysfunction). Doppler parameters are consistent with high ventricular filling pressure. - Ventricular septum: Septal motion showed abnormal function and dyssynergy. - Aortic valve: Mildly calcified annulus. Trileaflet; mildly calcified leaflets. There was mild regurgitation. - Aortic root: The aortic root was mildly dilated. - Mitral valve: Calcified annulus. There was mild regurgitation. - Left atrium: The atrium was moderately dilated. - Right atrium: The atrium was mildly dilated. Central venous pressure (est): 3 mm Hg. - Tricuspid valve: There was trivial regurgitation. - Pulmonary arteries: Systolic pressure could not be accurately estimated. - Pericardium, extracardiac: There was no pericardial effusion.  Impressions:  - Upper normal LV chamber size with mild LVH and  LVEF 20-25%, diffuse hypokinesis with severe mid anteroseptal hypokinesis and septal dyssynergy. Grade 1 diastolic dysfunction with increased filling pressures. Moderate left atrial enlargement. MAC with mild mitral regurgitation. Mildly dilated aortic root. Mild aortic regurgitation.  Scheduled Meds: . atorvastatin  40 mg Oral QHS  . docusate sodium  100 mg Oral Daily  . ferrous sulfate  325 mg Oral Daily  . metoprolol succinate  12.5 mg Oral Daily  . montelukast  10 mg Oral Daily  . rivaroxaban  20 mg Oral Q supper  . sodium chloride  3 mL Intravenous Q12H  . valACYclovir  1,000 mg Oral BID   Continuous Infusions:  PRN Meds:.acetaminophen **OR** acetaminophen, polyethylene glycol, traMADol-acetaminophen   ASSESSMENT/PLAN:    82 year old with chronic systolic heart failure/ cardiomyopathy ejection fraction 20-25%, left bundle branch block, acute kidney injury, shingles, elevated LFTs, mildly elevated troponin.  1. Chronic systolic heart failure/cardiomyopathy  - Chronic, stable  - Mildly elevated troponin, a result of chronic systolic heart failure, non-ACS  - Lisinopril currently on hold because of acute kidney injury, resume when comfortable  - Continue with very low-dose beta blocker.  - No further cardiac testing warranted. He is followed closely with [facility] healthcare and has seen Dr. Marlou Sa as of 01/16/15.  2. Hyperlipidemia  - Continue with atorvastatin  3. DVT  - On Xarelto  4. Shingles  - Acyclovir per primary team  5. Left bundle branch block  - Chronic  Once again, chronic systolic heart failure, EF has been noted to be in the 30% range previously. Followed at [facility]. No further cardiac evaluation necessary. He appears quite stable from this perspective. NYHA class 1-2.  We will sign off. Please call us with any questions.  Candee Furbish, MD  06/01/2015  10:04 AM

## 2015-06-01 NOTE — Clinical Social Work Placement (Signed)
   CLINICAL SOCIAL WORK PLACEMENT  NOTE  Date:  06/01/2015  Patient Details  Name: Nikesh Teschner MRN: [id number] Date of Birth: 17-May-1927  Clinical Social Work is seeking post-discharge placement for this patient at the [place] level of care (*CSW will initial, date and re-position this form in  chart as items are completed):  Yes   Patient/family provided with [place] Work Department's list of facilities offering this level of care within the geographic area requested by the patient (or if unable, by the patient's family).  Yes   Patient/family informed of their freedom to choose among providers that offer the needed level of care, that participate in Medicare, Medicaid or managed care program needed by the patient, have an available bed and are willing to accept the patient.  Yes   Patient/family informed of [place]'s ownership interest in [facility] and [facility], as well as of the fact that they are under no obligation to receive care at these facilities.  PASRR submitted to EDS on 06/01/15     PASRR number received on 06/01/15     Existing PASRR number confirmed on       FL2 transmitted to all facilities in geographic area requested by pt/family on 06/01/15     FL2 transmitted to all facilities within larger geographic area on 06/01/15 ([place])     Patient informed that his/her managed care company has contracts with or will negotiate with certain facilities, including the following:   (NA)         Patient/family informed of bed offers received.  Patient chooses bed at       Physician recommends and patient chooses bed at      Patient to be transferred to   on  .  Patient to be transferred to facility by Ambulance [facility])     Patient family notified on   of transfer.  Name of family member notified:        PHYSICIAN Please sign FL2, Please prepare priority discharge summary, including medications, Please sign  DNR, Please prepare prescriptions     Additional Comment:    _______________________________________________ Williemae Area, LCSW 06/01/2015, 4:55 PM

## 2015-06-01 NOTE — Progress Notes (Signed)
Pt continues to verbalize how unsafe he feels at home, pt verbalized on admission also. Social work is involved in pt care. Etta Quill, RN

## 2015-06-01 NOTE — Evaluation (Signed)
Physical Therapy Evaluation Patient Details Name: Jared Kim MRN: [id number] DOB: [birth year] Today's Date: 06/01/2015   History of Present Illness  81 year old male with history of essential hypertension, DVT on Xarelto (unknown duration), coronary artery disease, GERD, [place] which was mechanical in nature and did not lose consciousness. He sustained injury to his left arm and shoulder. Since then his family noted patient had poor appetite for by mouth intake and completing of right arm pain. No history of fever, chills, nausea, vomiting, chest pain, palpitations or shortness of breath, abdominal pain, bowel or urinary symptoms. Patient also having a rash on his right cheek and nose consistent with shingles..  Clinical Impression  Patient presents with confusion, generalized weakness and balance deficits impacting safe mobility. Requires Min A for all mobility due to unsteadiness and LOB x2. Difficulty navigating RW. Pt with poor safety awareness. Not safe to be home without hands on 24/7 S/assist. Daughter agreeable to [facility] as she is not able to provide this level of assist. Will follow acutely to maximize independence and mobility and minimize fall risk prior to d/c.     Follow Up Recommendations SNF;Supervision/Assistance - 24 hour    Equipment Recommendations  Rolling walker with 5" wheels    Recommendations for Other Services OT consult     Precautions / Restrictions Precautions Precautions: Fall Restrictions Weight Bearing Restrictions: No      Mobility  Bed Mobility Overal bed mobility: Needs Assistance Bed Mobility: Supine to Sit;Sit to Supine     Supine to sit: Min assist Sit to supine: Min guard   General bed mobility comments: Min A to elevate trunk and cues for hand placement. Use of rails for support.   Transfers Overall transfer level: Needs assistance Equipment used: Rolling walker (2 wheeled) Transfers: Sit to/from  Stand Sit to Stand: Min assist         General transfer comment: Min A to boost up from bed. Cues for hand placement. HR ranged from 90-115 bpm.  Ambulation/Gait Ambulation/Gait assistance: Min assist Ambulation Distance (Feet): 100 Feet Assistive device: Rolling walker (2 wheeled) Gait Pattern/deviations: Step-through pattern;Decreased stride length;Drifts right/left   Gait velocity interpretation: <1.8 ft/sec, indicative of risk for recurrent falls General Gait Details: Max cues for RW proximity and management. Unsteady on feet esp during turns requiring Min A for balance. 2 LOB during gait requiring heavy assist to prevent fall.  Stairs            Wheelchair Mobility    Modified Rankin (Stroke Patients Only)       Balance Overall balance assessment: Needs assistance;History of Falls Sitting-balance support: Feet supported;No upper extremity supported Sitting balance-Leahy Scale: Fair     Standing balance support: During functional activity Standing balance-Leahy Scale: Poor Standing balance comment: Relient on RW and Min A for balance.                              Pertinent Vitals/Pain Pain Assessment: No/denies pain    Home Living Family/patient expects to be discharged to:: Skilled nursing facility Living Arrangements: Children Available Help at Discharge: Family Type of Home: House Home Access: Stairs to enter Entrance Stairs-Rails: Right;Left;Can reach both Technical brewer of Steps: 6-7 Home Layout: One level Home Equipment: Shower seat - built in;Cane - single point;Grab bars - toilet      Prior Function Level of Independence: Independent with assistive device(s)  Hand Dominance   Dominant Hand: Right    Extremity/Trunk Assessment   Upper Extremity Assessment: Defer to OT evaluation           Lower Extremity Assessment: Generalized weakness         Communication   Communication: No  difficulties  Cognition Arousal/Alertness: Awake/alert Behavior During Therapy: WFL for tasks assessed/performed Overall Cognitive Status: No family/caregiver present to determine baseline cognitive functioning ("[place]" "16" [place] state location.)                      General Comments      Exercises        Assessment/Plan    PT Assessment Patient needs continued PT services  PT Diagnosis Difficulty walking;Generalized weakness   PT Problem List Decreased strength;Decreased cognition;Decreased balance;Decreased mobility;Decreased activity tolerance;Decreased knowledge of use of DME;Decreased safety awareness  PT Treatment Interventions Balance training;Gait training;Functional mobility training;Therapeutic activities;Therapeutic exercise;Patient/family education;DME instruction   PT Goals (Current goals can be found in the Care Plan section) Acute Rehab PT Goals Patient Stated Goal: to go home PT Goal Formulation: With patient Time For Goal Achievement: 06/15/15 Potential to Achieve Goals: Fair    Frequency Min 3X/week   Barriers to discharge Decreased caregiver support;Inaccessible home environment Pt's daughter will not be there 24/7 and has a bad back.    Co-evaluation               End of Session Equipment Utilized During Treatment: Gait belt Activity Tolerance: Patient tolerated treatment well Patient left: in bed;with call bell/phone within reach;with bed alarm set Nurse Communication: Mobility status         Time: 6226-3335 PT Time Calculation (min) (ACUTE ONLY): 16 min   Charges:   PT Evaluation $Initial PT Evaluation Tier I: 1 Procedure     PT G Codes:        Bellany Elbaum A Cataldo Cosgriff 06/01/2015, 10:32 AM  Wray Kearns, PT, DPT [phone]

## 2015-06-01 NOTE — Progress Notes (Signed)
Initial Nutrition Assessment  DOCUMENTATION CODES:   Severe malnutrition in context of acute illness/injury  INTERVENTION:    Ensure Enlive PO BID, each supplement provides 350 kcal and 20 grams of protein  NUTRITION DIAGNOSIS:   Malnutrition related to acute illness as evidenced by energy intake < or equal to 50% for > or equal to 5 days, moderate depletions of muscle mass.  GOAL:   Patient will meet greater than or equal to 90% of their needs  MONITOR:   PO intake, Supplement acceptance, Labs, Weight trends  REASON FOR ASSESSMENT:   Malnutrition Screening Tool    ASSESSMENT:   81 y.o. male past medical history of essential hypertension and a fall 2 weeks ago, he denies any loss of consciousness, decreased appetite for the last several days with oral intake and mild frontal headache.   Labs reviewed: potassium low; mag WNL Serum glucoses 118-120 (8/13-8/14).  Nutrition-Focused physical exam completed. Findings are mild-moderate fat depletion, and mild-moderate and severe muscle depletion. Patient reports that he usually eats well and has lost some weight, but unsure how much. He thinks he has been eating poorly since right arm injury 2 weeks ago. Hx of dementia noted. Per MD progress notes patient has been eating poorly for the past 10 days. He likes milkshakes and is willing to try Ensure. He ate 70% of his breakfast this AM.  Diet Order:   Heart Healthy  Skin:  Reviewed, no issues  Last BM:  8/15  Height:   Ht Readings from Last 1 Encounters:  05/30/15 6\' 1"  (1.854 m)    Weight:   Wt Readings from Last 1 Encounters:  06/01/15 179 lb 12.8 oz (81.557 kg)    Ideal Body Weight:  83.6 kg  BMI:  Body mass index is 23.73 kg/(m^2).  Estimated Nutritional Needs:   Kcal:  1900-2100  Protein:  95-115 gm  Fluid:  >/= 2 L  EDUCATION NEEDS:   No education needs identified at this time  Molli Barrows, [place], [place], [place] Pager [phone] After Hours Pager  [phone]

## 2015-06-02 ENCOUNTER — Inpatient Hospital Stay (HOSPITAL_COMMUNITY): Payer: Medicare Other

## 2015-06-02 DIAGNOSIS — M25511 Pain in right shoulder: Secondary | ICD-10-CM

## 2015-06-02 DIAGNOSIS — R531 Weakness: Secondary | ICD-10-CM

## 2015-06-02 LAB — URINALYSIS, ROUTINE W REFLEX MICROSCOPIC
Glucose, UA: NEGATIVE mg/dL
KETONES UR: 15 mg/dL — AB
NITRITE: NEGATIVE
PH: 5.5 (ref 5.0–8.0)
Protein, ur: 30 mg/dL — AB
SPECIFIC GRAVITY, URINE: 1.023 (ref 1.005–1.030)
Urobilinogen, UA: 1 mg/dL (ref 0.0–1.0)

## 2015-06-02 LAB — URINE MICROSCOPIC-ADD ON

## 2015-06-02 LAB — CBC
HEMATOCRIT: 31 % — AB (ref 39.0–52.0)
Hemoglobin: 10.6 g/dL — ABNORMAL LOW (ref 13.0–17.0)
MCH: 29.4 pg (ref 26.0–34.0)
MCHC: 34.2 g/dL (ref 30.0–36.0)
MCV: 85.9 fL (ref 78.0–100.0)
Platelets: 249 10*3/uL (ref 150–400)
RBC: 3.61 MIL/uL — ABNORMAL LOW (ref 4.22–5.81)
RDW: 13.4 % (ref 11.5–15.5)
WBC: 14.4 10*3/uL — ABNORMAL HIGH (ref 4.0–10.5)

## 2015-06-02 MED ORDER — LEVOFLOXACIN IN D5W 750 MG/150ML IV SOLN
750.0000 mg | INTRAVENOUS | Status: DC
  Administered 2015-06-02: 750 mg via INTRAVENOUS
  Filled 2015-06-02 (×2): qty 150

## 2015-06-02 MED ORDER — TRAMADOL-ACETAMINOPHEN 37.5-325 MG PO TABS: 1.0000 | ORAL_TABLET | Freq: Four times a day (QID) | ORAL | Status: AC | PRN

## 2015-06-02 MED ORDER — ENSURE ENLIVE PO LIQD: 237.0000 mL | Freq: Two times a day (BID) | ORAL | Status: AC

## 2015-06-02 MED ORDER — VALACYCLOVIR HCL 1 G PO TABS: 1000.0000 mg | ORAL_TABLET | Freq: Two times a day (BID) | ORAL | Status: AC

## 2015-06-02 NOTE — Progress Notes (Signed)
CSW (Clinical Education officer, museum) notified pt dc has been cancelled. CSW notified [facility]. If pt is able to meet to remain inpatient tonight, they will be able to accept pt tomorrow. Pt nurse to notify daughter.  [place], [place]

## 2015-06-02 NOTE — Care Management Note (Addendum)
Case Management Note  Patient Details  Name: Jared Kim MRN: [id number] Date of Birth: [birth year]  Subjective/Objective:   Pt admitted for AKI. Pt is from home with daughter. Plan was to be d/c to SNF, however unable to get to Skilled at this time due to inusrance. Plan will be to return to home with daughter. Per daughter she has retired and husband is semi retried. They take one week a month to travel. CM did offer the Personal care list for pt to pay out of pocket for supervision. Per pt's daughter pt gets $1245 a month. Daughter did ask CM to see if pt would be eligible for Medicaid. CM did refer daughter [name] to DSS for assistance.                 Action/Plan: CM did make referral with [facility] for [facility] services listed above. Plan is for home today. CM did touch base with physician in reference to home care needs. Will need face to face as well. No further needs from CM at this time.    Expected Discharge Date:                  Expected Discharge Plan:  [place]  In-House Referral:  Clinical Social Work  Discharge planning Services  CM Consult  Post Acute Care Choice:  Home Health Choice offered to:  Adult Children  DME Arranged:  N/A DME Agency:  NA  HH Arranged:  PT, OT, Nurse's Aide (Education officer, museum) [place]:  [place]  Status of Service:  Completed, signed off  Medicare Important Message Given:    Date Medicare IM Given:    Medicare IM give by:    Date Additional Medicare IM Given:    Additional Medicare Important Message give by:     If discussed at [place] of Stay Meetings, dates discussed:    Additional Comments :1204 06-02-15 Jacqlyn Krauss, RN,BSN [phone] CM did speak with RN- pt spiked a temperature. Blood Cultures to be obtained. Staff RN to call MD to make aware. CM will continue to monitor for disposition needs. Most likely pt will now qualify for SNF once medically stable.Staff RN did make daughter aware.    Bethena Roys, RN 06/02/2015, 11:40 AM

## 2015-06-02 NOTE — Discharge Summary (Signed)
Physician Discharge Summary  Jared Kim NFA:[id number] DOB: [birth year] DOA: 05/30/2015  PCP: Vidal Schwalbe, MD  Admit date: 05/30/2015 Discharge date: 06/02/2015  Time spent: 30 minutes  Recommendations for Outpatient Follow-up:  1. Discharge to SNF  Completes valtrex on 8/23. 2. Please follow renal function in 3-4 days.  3. If patient's right shoulder still has limited ROM and weakness in 1 week , he needs a CT or MRI to r/o any fracture or tissue injury  Discharge Diagnoses:   Principal Problem:   AKI (acute kidney injury)  Active Problems:   Essential hypertension   Elevated troponin   Shingles   Severe malnutrition   Secondary cardiomyopathy   Right anterior shoulder pain   Coronary artery diease    Discharge Condition: fair  Diet recommendation: heart health with supplements  Filed Weights   05/31/15 0430 06/01/15 0500 06/02/15 0610  Weight: 80.06 kg (176 lb 8 oz) 81.557 kg (179 lb 12.8 oz) 79.969 kg (176 lb 4.8 oz)    History of present illness:  81 year old male with history of essential hypertension, DVT on Xarelto (unknown duration), coronary artery disease, GERD, who [place] which was mechanical in nature and did not lose consciousness. He sustained injury to his left arm and shoulder. Since then his family noted patient had poor appetite for by mouth intake and completing of right arm pain. No history of fever, chills, nausea, vomiting, chest pain, palpitations or shortness of breath, abdominal pain, bowel or urinary symptoms. Patient also having a rash on his right cheek and nose consistent with shingles.. In the ED vitals were stable. Blood work showed leukocytosis, hemoglobin of 11.4, mild hyponatremia with sodium of 130, hypokalemia (2.1) acute kidney injury with creatinine 1.5 and mildly elevated troponin. Patient admitted to telemetry.  Hospital Course:  Acute kidney injury Appears to be prerenal with poor by mouth  intake for past 10 days. Patient also taking lisinopril. His lisinopril was held and received gentle IV hydration. Renal function has improved to baseline. UA negative for UTI.  Hyponatremia/hypokalemia Replenished low potassium. Monitor Na as outpt  generalized weakness with fall and right arm /shoulder pain . X-ray of the right arm and shoulder without any fracture or acute injury. Pain control with when necessary Ultracet. Patient able to move his arm better today. Please monitor with PT at SNF. If still has pain and weakness of rt arm and shoulder in 1 week, he will need a CT or MRI of his shoulder to r/o occult fracute.   Elevated troponin level history of cardiomyopathy Troponin peaked 0.10. EKG shows LBBB . No chest pain, shortness of breath or palpitation. Possibly demand ischemia.has underlying cardiomyopathy with EF of 30-35%. Cardiology consulted. Patient apparently had 2-D echo done at [facility] in March with similar EF and recommend this to be chronic and no further cardiac workup recommended. On low-dose beta blocker. Resume lisinopril on discharge.    Essential hypertension Blood pressure stable.   History of DVT On Xarelto which has been continued. No clear duration of anticoagulation.( prescribed by his PCP)  Shingles on the face on Valtrex. Improved. Will treat for total 10 dyas  CAD Continue Lipitor and metoprolol.  Anemia Possibly iron deficiency. Is on iron supplements at home. Hemoglobin appears at baseline from 2 years ago.  Severe malnutrition. Appreciate nutrition evaluation and added supplements.  Diet: Heart healthy   Code Status: Full code Family Communication: Updated daughter on the phone on 8/15 Disposition  Plan: PT evaluation recommends SNF.Marland Kitchen    Consultants:  cardiology  Procedures:  2-D echo  Antibiotics:  None    Discharge Exam: Filed Vitals:   06/02/15 0610  BP: 129/74  Pulse: 88  Temp: 99.6 F (37.6 C)  Resp: 18      General: elderly thin built male in no acute distress  HEENT: Shingles lower right cheek and nose( improved since admission, no open lesions) , moist oral mucosa  Chest: Clear to auscultation bilaterally  CVS: S1 and S2 regular, no murmurs rub or gallop  GI: Soft, nondistended, nontender, bowel sounds present  Musculoskeletal: Limited mobility  of right shoulder (Improved since admission) Chronic skin changes and varicose veins, no edema  CNS: Alert and oriented  Discharge Instructions    Current Discharge Medication List    START taking these medications   Details  feeding supplement, ENSURE ENLIVE, (ENSURE ENLIVE) LIQD Take 237 mLs by mouth 2 (two) times daily between meals. Qty: 237 mL, Refills: 12    traMADol-acetaminophen (ULTRACET) 37.5-325 MG per tablet Take 1 tablet by mouth every 6 (six) hours as needed for moderate pain. Qty: 30 tablet, Refills: 0    valACYclovir (VALTREX) 1000 MG tablet Take 1 tablet (1,000 mg total) by mouth 2 (two) times daily. Qty: 16 tablet, Refills: 0      CONTINUE these medications which have NOT CHANGED   Details  atorvastatin (LIPITOR) 40 MG tablet Take 40 mg by mouth at bedtime.    docusate sodium (COLACE) 100 MG capsule Take 100 mg by mouth daily.    ferrous sulfate 325 (65 FE) MG tablet Take 325 mg by mouth daily.    lisinopril (PRINIVIL,ZESTRIL) 10 MG tablet Take 10 mg by mouth daily.    metoprolol succinate (TOPROL-XL) 25 MG 24 hr tablet Take 12.5 mg by mouth daily.    montelukast (SINGULAIR) 10 MG tablet Take 10 mg by mouth daily.    Tamsulosin HCl (FLOMAX) 0.4 MG CAPS Take 0.4 mg by mouth daily.    XARELTO 20 MG TABS tablet Take 20 mg by mouth daily.       No Known Allergies Follow-up Information    Please follow up.   Why:  MD at SNF       The results of significant diagnostics from this hospitalization (including imaging, microbiology, ancillary and laboratory) are listed below for reference.     Significant Diagnostic Studies: Dg Shoulder Right  05/30/2015   CLINICAL DATA:  81 year old male with history of fall 10 days ago complaining of pain in the right shoulder unable to lift the right arm.  EXAM: RIGHT SHOULDER - 2+ VIEW  COMPARISON:  No priors.  FINDINGS: Large subacromial spur. Degenerative changes of osteoarthritis at the acromioclavicular and glenohumeral joints. No acute displaced fracture, subluxation or dislocation.  IMPRESSION: 1. No acute radiographic abnormality of the right shoulder. 2. Degenerative changes of osteoarthritis in the glenohumeral and acromioclavicular joints. 3. Large subacromial spur.   Electronically Signed   By: Vinnie Langton M.D.   On: 05/30/2015 14:33   Dg Elbow Complete Right  05/30/2015   CLINICAL DATA:  Status post fall 10 days ago.  Right arm pain.  EXAM: RIGHT ELBOW - COMPLETE 3+ VIEW  COMPARISON:  None.  FINDINGS: Small calcification along the right radial head. There is no evidence of fracture, dislocation, or joint effusion. There is no evidence of arthropathy or other focal bone abnormality. Soft tissues are unremarkable.  IMPRESSION: 1. No acute fracture or dislocation of the  right elbow. 2. Calcification along the right radial head which may be within the soft tissues and are likely dystrophic. The appearance is atypical for an avulsive injury.   Electronically Signed   By: Kathreen Devoid   On: 05/30/2015 14:45   Dg Forearm Right  05/30/2015   CLINICAL DATA:  Fall, pain  EXAM: RIGHT FOREARM - 2 VIEW  COMPARISON:  Right elbow same day  FINDINGS: Two views of the right forearm submitted. No acute fracture or subluxation. No radiopaque foreign body.  IMPRESSION: Negative.   Electronically Signed   By: Lahoma Crocker M.D.   On: 05/30/2015 15:33   Ct Head Wo Contrast  05/30/2015   CLINICAL DATA:  81 year old male with history of trauma from a fall 10 days ago. Headache. Unable to lift right arm. Patient takes the Xarelto for history of DVT.  EXAM: CT HEAD  WITHOUT CONTRAST  TECHNIQUE: Contiguous axial images were obtained from the base of the skull through the vertex without intravenous contrast.  COMPARISON:  Head CT 10/23/2012.  FINDINGS: Moderate cerebral and mild cerebellar atrophy. Patchy and confluent areas of decreased attenuation are noted throughout the deep and periventricular white matter of the cerebral hemispheres bilaterally, compatible with chronic microvascular ischemic disease. No acute displaced skull fractures are identified. No acute intracranial abnormality. Specifically, no evidence of acute post-traumatic intracranial hemorrhage, no definite regions of acute/subacute cerebral ischemia, no focal mass, mass effect, hydrocephalus or abnormal intra or extra-axial fluid collections. The visualized paranasal sinuses and mastoids are well pneumatized.  IMPRESSION: 1. No signs of significant acute traumatic injury to the skull or brain. 2. Moderate cerebral and mild cerebellar atrophy with extensive chronic microvascular ischemic changes in the cerebral white matter redemonstrated, as above.   Electronically Signed   By: Vinnie Langton M.D.   On: 05/30/2015 14:41   Ct Cervical Spine Wo Contrast  05/30/2015   CLINICAL DATA:  81 year old male with history of trauma from a fall 10 days ago complaining of neck pain. Patient takes Xarelto for DVT.  EXAM: CT CERVICAL SPINE WITHOUT CONTRAST  TECHNIQUE: Multidetector CT imaging of the cervical spine was performed without intravenous contrast. Multiplanar CT image reconstructions were also generated.  COMPARISON:  No priors.  FINDINGS: No acute displaced fracture of the cervical spine. Alignment is anatomic. Prevertebral soft tissues are normal. Multilevel degenerative disc disease, most severe at C5-C6 and T1-T2. Multilevel facet arthropathy. Visualized portions of the upper thorax are unremarkable.  IMPRESSION: 1. No signs of significant acute traumatic injury to the cervical spine. 2. Multilevel  degenerative disc disease and cervical spondylosis, as above.   Electronically Signed   By: Vinnie Langton M.D.   On: 05/30/2015 15:20   Dg Humerus Right  05/30/2015   CLINICAL DATA:  81 year old male status post fall with right arm pain. Initial encounter.  EXAM: RIGHT HUMERUS - 2+ VIEW  COMPARISON:  Right elbow series and shoulder series from today reported separately.  FINDINGS: Grossly normal alignment about the right shoulder and elbow. Osteopenia. No acute right humerus fracture. Visible right ribs also appear intact.  IMPRESSION: No acute fracture or dislocation identified about the right humerus.   Electronically Signed   By: Genevie Ann M.D.   On: 05/30/2015 15:35    Microbiology: No results found for this or any previous visit (from the past 240 hour(s)).   Labs: Basic Metabolic Panel:  Recent Labs Lab 05/30/15 1330 05/31/15 0258 05/31/15 0946 06/01/15 1219  NA 130* 130*  --  130*  K 3.7 3.1*  --  4.0  CL 97* 101  --  99*  CO2 22 21*  --  24  GLUCOSE 118* 120*  --  111*  BUN 26* 20  --  11  CREATININE 1.50* 1.19  --  0.97  CALCIUM 9.0 8.2*  --  8.5*  MG  --   --  2.0  --    Liver Function Tests:  Recent Labs Lab 05/30/15 1330  AST 42*  ALT 21  ALKPHOS 96  BILITOT 1.1  PROT 7.4  ALBUMIN 2.8*   No results for input(s): LIPASE, AMYLASE in the last 168 hours. No results for input(s): AMMONIA in the last 168 hours. CBC:  Recent Labs Lab 05/30/15 1330 05/31/15 0258  WBC 13.5* 12.8*  HGB 11.4* 10.3*  HCT 32.9* 30.0*  MCV 85.2 85.7  PLT 246 208   Cardiac Enzymes:  Recent Labs Lab 05/30/15 1330 05/30/15 1703 05/30/15 2132 05/31/15 0258 05/31/15 0946  TROPONINI 0.04* 0.06* 0.09* 0.10* 0.08*   BNP: BNP (last 3 results) No results for input(s): BNP in the last 8760 hours.  ProBNP (last 3 results) No results for input(s): PROBNP in the last 8760 hours.  CBG: No results for input(s): GLUCAP in the last 168 hours.     SignedLouellen Molder  [facility] 06/02/2015, 10:12 AM

## 2015-06-02 NOTE — Progress Notes (Signed)
CSW (Clinical Education officer, museum) aware that pt is medically stable for dc today but does not have a 3 night stay for Medicare coverage of SNF. CSW spoke with pt daughter about private pay. At this time, she is unable to pay privately for SNF. CSW spoke with supervisor about potential for LOG. At this time, per CSW supervisor pt is not appropriate for LOG coverage for SNF. Pt daughter open to talking about home health. CSW notified RNCM and pt nurse of plan for dc home with daughter. Pt daughter stated she is retired but is not home at all times. CSW suggested she attempt to arrange 24 hr supervision/assistance for pt at least for days following discharge. At this time, pt has no further hospital social work needs. CSW signing off.  [place], [place]

## 2015-06-02 NOTE — Clinical Social Work Note (Signed)
Clinical Social Work Assessment  Patient Details  Name: Jared Kim MRN: [id number] Date of Birth: 1927/02/04  Date of referral:  06/01/15               Reason for consult:  Facility Placement                Permission sought to share information with:  Facility Sport and exercise psychologist, Family Supports Permission granted to share information::  Yes, Verbal Permission Granted  Name::     Daughter [name]  [phone] , [place] [facility] [place] SNF's  Agency::     Relationship::     Contact Information:     Housing/Transportation Living arrangements for the past 2 months:  Single Family Home (Lives with daughter and her husband) Source of Information:  Adult Children, Patient Patient Interpreter Needed:  None Criminal Activity/Legal Involvement Pertinent to Current Situation/Hospitalization:  No - Comment as needed Significant Relationships:  Adult Children Lives with:  Adult Children Do you feel safe going back to the place where you live?  Yes Need for family participation in patient care:  Yes (Comment)  Care giving concerns: Patient is alert to person and place only; this is baseline per daughter. Lives at home with daughter and her husband as the sole caregiver. Daughter reports "he can be really hard on me- wanting me with him and doing things all the time- right now!"  Daughter states that this can be overwhelming.  Social Worker assessment / plan:  CSW referred to assist this 81 year old male with short term SNF per PT recommendations. CSW met with patient and daughter [name] who is his current caregiver at home.  Patient noted to be lying in bed- appeared to be asleep but aroused when CSW spoke to him.  He was able to respond to simple questions but defers d/c planning to his daughter [name].  Discussed short term SNF process. She has received a SNF list from the nursing staff and indicated that she would prefer either [facility] or [facility]. Fl2 completed and will  be placed on chart for MD's signature. Active bed search initiated.    Employment status:  Retired Forensic scientist:  [employer] PT Recommendations:  [place], Not assessed at this time Information / Referral to community resources:  [place]  Patient/Family's Response to care: Daughter requests short term SNF for rehab. She feels that patient could benefit from period of strengthening. Patient is agreeable as long as this is what his daughter wants.  Patient/Family's Understanding of and Emotional Response to Diagnosis, Current Treatment, and Prognosis:  Due to patient's level of confusion- he is unable to verbalize a true understanding of his diagnosis, treatment plan or prognosis.  His daughter is very involved and states that she feels that he is doing better.  She is able to verbalize her understanding of his current diagnosis as well as treatment plan.   Emotional Assessment Appearance:  Appears stated age Attitude/Demeanor/Rapport:   (quiet and sleepy; very little interaction with CSW- pleasant) Affect (typically observed):  Calm, Happy, Quiet, Appropriate, Pleasant Orientation:  Oriented to Self Alcohol / Substance use:  Never Used Psych involvement (Current and /or in the community):  No (Comment)  Discharge Needs  Concerns to be addressed:  Care Coordination Readmission within the last 30 days:  No Current discharge risk:  None Barriers to Discharge:  No Barriers Identified   Estill Bakes 06/01/2015   5:55 PM

## 2015-06-02 NOTE — Progress Notes (Signed)
Patient febrile to 101.55F. Cultures, cxr and UA ordered. Will hold off on discharge.

## 2015-06-02 NOTE — Care Management Important Message (Signed)
Important Message  Patient Details  Name: Jared Kim MRN: [id number] Date of Birth: Oct 25, 1926   Medicare Important Message Given:  Yes-second notification given    Pricilla Handler 06/02/2015, 12:14 PM

## 2015-06-02 NOTE — Progress Notes (Signed)
ANTIBIOTIC CONSULT NOTE - INITIAL  Pharmacy Consult for Levaquin Indication: SIRS  No Known Allergies  Patient Measurements: Height: 6\' 1"  (185.4 cm) Weight: 176 lb 4.8 oz (79.969 kg) IBW/kg (Calculated) : 79.9  Vital Signs: Temp: 100.4 F (38 C) (08/16 1618) Temp Source: Oral (08/16 1618) BP: 120/58 mmHg (08/16 1442) Pulse Rate: 100 (08/16 1442) Intake/Output from previous day: 08/15 0701 - 08/16 0700 In: 840 [P.O.:840] Out: 625 [Urine:625] Intake/Output from this shift:    Labs:  Recent Labs  05/30/15 1654 05/31/15 0258 06/01/15 1219  WBC  --  12.8*  --   HGB  --  10.3*  --   PLT  --  208  --   LABCREA 181.65  --   --   CREATININE  --  1.19 0.97   Estimated Creatinine Clearance: 61.8 mL/min (by C-G formula based on Cr of 0.97). No results for input(s): VANCOTROUGH, VANCOPEAK, VANCORANDOM, GENTTROUGH, GENTPEAK, GENTRANDOM, TOBRATROUGH, TOBRAPEAK, TOBRARND, AMIKACINPEAK, AMIKACINTROU, AMIKACIN in the last 72 hours.   Microbiology: No results found for this or any previous visit (from the past 720 hour(s)).  Medical History: Past Medical History  Diagnosis Date  . Coronary artery disease   . Hypertension   . High cholesterol   . GERD (gastroesophageal reflux disease)   . DVT (deep venous thrombosis)     Medications:  Prescriptions prior to admission  Medication Sig Dispense Refill Last Dose  . atorvastatin (LIPITOR) 40 MG tablet Take 40 mg by mouth at bedtime.   05/29/2015 at Unknown time  . docusate sodium (COLACE) 100 MG capsule Take 100 mg by mouth daily.   05/30/2015 at Unknown time  . ferrous sulfate 325 (65 FE) MG tablet Take 325 mg by mouth daily.   05/30/2015 at Unknown time  . lisinopril (PRINIVIL,ZESTRIL) 10 MG tablet Take 10 mg by mouth daily.   05/30/2015 at Unknown time  . metoprolol succinate (TOPROL-XL) 25 MG 24 hr tablet Take 12.5 mg by mouth daily.   05/30/2015 at 800  . montelukast (SINGULAIR) 10 MG tablet Take 10 mg by mouth daily.    05/30/2015 at Unknown time  . Tamsulosin HCl (FLOMAX) 0.4 MG CAPS Take 0.4 mg by mouth daily.   05/30/2015 at Unknown time  . XARELTO 20 MG TABS tablet Take 20 mg by mouth daily.   05/30/2015 at am   Assessment: 81 y/o male admitted after fall on 8/13 who is being treated for possible shingles. Patient was to discharge home today but now is febrile. Pharmacy consulted to begin Levaquin. WBC were 12.8 on 8/14. Renal function is normal. Blood and urine cultures pending.  Goal of Therapy:  Eradication of infection  Plan:  - Levaquin 750 mg IV q24h - Monitor renal function, clinical progress and culture data  [facility], Pharm.D., BCPS Clinical Pharmacist Pager: [phone] 06/02/2015 4:55 PM

## 2015-06-02 NOTE — Progress Notes (Addendum)
Occupational Therapy Treatment Patient Details Name: Jared Kim MRN: [id number] DOB: [birth year] Today's Date: 06/02/2015    History of present illness 81 year old male with history of essential hypertension, DVT on Xarelto (unknown duration), coronary artery disease, GERD, who [place] about 10 [place] back which was mechanical in nature and did not lose consciousness. He sustained injury to his left arm and shoulder. Since then his family noted patient had poor appetite for by mouth intake and completing of right arm pain. No history of fever, chills, nausea, vomiting, chest pain, palpitations or shortness of breath, abdominal pain, bowel or urinary symptoms. Patient also having a rash on his right cheek and nose consistent with shingles..   OT comments  Plan is to d/c to SNF. Education provided in session.   Follow Up Recommendations  SNF    Equipment Recommendations  Other (comment) (defer to next venue)    Recommendations for Other Services      Precautions / Restrictions Precautions Precautions: Fall Restrictions Weight Bearing Restrictions: No       Mobility Bed Mobility Overal bed mobility: Needs Assistance Bed Mobility: Supine to Sit     Supine to sit: Mod assist     General bed mobility comments: assist with trunk and cues for technique.  Transfers Overall transfer level: Needs assistance Equipment used: Rolling walker (2 wheeled) Transfers: Sit to/from [facility] Sit to Stand: Mod assist;Min assist Stand pivot transfers: Min guard       General transfer comment: assist to boost and cues for hand placement. Pt requiring Mod assist for sit to stand from [facility] but less assist given with additional sit to stand from [facility].     Balance       Used RW for support for stand pivot transfer/some steps and upon standing.                             ADL Overall ADL's : Needs assistance/impaired                          Toilet Transfer: Moderate assistance;Stand-pivot;RW;BSC;Min Government social research officer Details (indicate cue type and reason): see ADL section below Toileting- Clothing Manipulation and Hygiene: Total assistance;Sit to/from stand       Functional mobility during ADLs: Minimal assistance;Rolling walker;Min guard (some steps taken-not many; also stand pivot ) General ADL Comments: Pt had BM in session. Elevated pt's Rt arm on pillow at end of session. Told pt not to strain when having BM. Mod assist to stand from bed and Min guard to pivot to [facility] (Min-Mod assist to stand from [facility] and Min assist to return to chair)      [facility] During Therapy: [facility] for tasks assessed/performed Overall Cognitive Status: No family/caregiver present to determine baseline cognitive functioning (not oriented to place)       Memory: Decreased short-term memory      Comments: Noted 1x when OT asked him about the place we were at, he answered 2016. Oriented pt a couple times in session.             Extremity/Trunk Assessment  Exercises     Shoulder Instructions       General Comments      Pertinent Vitals/ Pain       Pain Assessment: 0-10 Pain Score: 7  Pain Location: right arm Pain Descriptors / Indicators: Sore Pain Intervention(s): Repositioned;Monitored during session  Home Living                                          Prior Functioning/Environment              Frequency Min 2X/week     Progress Toward Goals  OT Goals(current goals can now be found in the care plan section)  Progress towards OT goals: Progressing toward goals  Acute Rehab OT Goals Patient Stated Goal: not stated OT Goal Formulation: With patient Time For Goal Achievement: 06/07/15 Potential to Achieve Goals: Good ADL Goals Pt Will Perform Lower Body Dressing:  with set-up;with supervision;sit to/from stand;with adaptive equipment Pt Will Transfer to Toilet: with min guard assist;ambulating;grab bars (elevated toilet) Pt Will Perform Toileting - Clothing Manipulation and hygiene: sit to/from stand;with set-up;with supervision  Plan Discharge plan needs to be updated    Co-evaluation                 End of Session Equipment Utilized During Treatment: Gait belt;Rolling walker   Activity Tolerance Patient tolerated treatment well   Patient Left in chair;with call bell/phone within reach   Nurse Communication Other (comment);Mobility status (nurse going to get chair alarm/pad; had BM)        Time: [phone] (some time spent on BSC having BM) OT Time Calculation (min): 20 min  Charges: OT General Charges $OT Visit: 1 Procedure OT Treatments $Therapeutic Activity: 8-22 mins  Benito Mccreedy OTR/L [phone] 06/02/2015, 4:33 PM

## 2015-06-03 DIAGNOSIS — S098XXA Other specified injuries of head, initial encounter: Secondary | ICD-10-CM | POA: Diagnosis not present

## 2015-06-03 DIAGNOSIS — M2550 Pain in unspecified joint: Secondary | ICD-10-CM | POA: Diagnosis not present

## 2015-06-03 DIAGNOSIS — Y998 Other external cause status: Secondary | ICD-10-CM | POA: Diagnosis not present

## 2015-06-03 DIAGNOSIS — M7989 Other specified soft tissue disorders: Secondary | ICD-10-CM | POA: Diagnosis not present

## 2015-06-03 DIAGNOSIS — E41 Nutritional marasmus: Secondary | ICD-10-CM | POA: Diagnosis not present

## 2015-06-03 DIAGNOSIS — N189 Chronic kidney disease, unspecified: Secondary | ICD-10-CM | POA: Diagnosis not present

## 2015-06-03 DIAGNOSIS — R05 Cough: Secondary | ICD-10-CM | POA: Diagnosis not present

## 2015-06-03 DIAGNOSIS — E43 Unspecified severe protein-calorie malnutrition: Secondary | ICD-10-CM | POA: Diagnosis not present

## 2015-06-03 DIAGNOSIS — S59901A Unspecified injury of right elbow, initial encounter: Secondary | ICD-10-CM | POA: Diagnosis not present

## 2015-06-03 DIAGNOSIS — K219 Gastro-esophageal reflux disease without esophagitis: Secondary | ICD-10-CM | POA: Diagnosis not present

## 2015-06-03 DIAGNOSIS — S0191XA Laceration without foreign body of unspecified part of head, initial encounter: Secondary | ICD-10-CM | POA: Diagnosis not present

## 2015-06-03 DIAGNOSIS — S1191XA Laceration without foreign body of unspecified part of neck, initial encounter: Secondary | ICD-10-CM | POA: Diagnosis not present

## 2015-06-03 DIAGNOSIS — S0990XA Unspecified injury of head, initial encounter: Secondary | ICD-10-CM | POA: Diagnosis present

## 2015-06-03 DIAGNOSIS — Z9181 History of falling: Secondary | ICD-10-CM | POA: Diagnosis not present

## 2015-06-03 DIAGNOSIS — I429 Cardiomyopathy, unspecified: Secondary | ICD-10-CM | POA: Diagnosis not present

## 2015-06-03 DIAGNOSIS — B029 Zoster without complications: Secondary | ICD-10-CM | POA: Diagnosis not present

## 2015-06-03 DIAGNOSIS — R630 Anorexia: Secondary | ICD-10-CM | POA: Diagnosis not present

## 2015-06-03 DIAGNOSIS — E785 Hyperlipidemia, unspecified: Secondary | ICD-10-CM | POA: Diagnosis not present

## 2015-06-03 DIAGNOSIS — Y92129 Unspecified place in nursing home as the place of occurrence of the external cause: Secondary | ICD-10-CM | POA: Diagnosis not present

## 2015-06-03 DIAGNOSIS — Z23 Encounter for immunization: Secondary | ICD-10-CM | POA: Diagnosis not present

## 2015-06-03 DIAGNOSIS — I2510 Atherosclerotic heart disease of native coronary artery without angina pectoris: Secondary | ICD-10-CM | POA: Diagnosis not present

## 2015-06-03 DIAGNOSIS — Y9389 Activity, other specified: Secondary | ICD-10-CM | POA: Diagnosis not present

## 2015-06-03 DIAGNOSIS — Z8719 Personal history of other diseases of the digestive system: Secondary | ICD-10-CM | POA: Diagnosis not present

## 2015-06-03 DIAGNOSIS — I82409 Acute embolism and thrombosis of unspecified deep veins of unspecified lower extremity: Secondary | ICD-10-CM | POA: Diagnosis not present

## 2015-06-03 DIAGNOSIS — W1839XA Other fall on same level, initial encounter: Secondary | ICD-10-CM | POA: Diagnosis not present

## 2015-06-03 DIAGNOSIS — Z79899 Other long term (current) drug therapy: Secondary | ICD-10-CM | POA: Diagnosis not present

## 2015-06-03 DIAGNOSIS — L309 Dermatitis, unspecified: Secondary | ICD-10-CM | POA: Diagnosis not present

## 2015-06-03 DIAGNOSIS — I959 Hypotension, unspecified: Secondary | ICD-10-CM | POA: Diagnosis not present

## 2015-06-03 DIAGNOSIS — E871 Hypo-osmolality and hyponatremia: Secondary | ICD-10-CM | POA: Diagnosis not present

## 2015-06-03 DIAGNOSIS — R410 Disorientation, unspecified: Secondary | ICD-10-CM | POA: Diagnosis not present

## 2015-06-03 DIAGNOSIS — M25521 Pain in right elbow: Secondary | ICD-10-CM | POA: Diagnosis present

## 2015-06-03 DIAGNOSIS — M25511 Pain in right shoulder: Secondary | ICD-10-CM | POA: Diagnosis not present

## 2015-06-03 DIAGNOSIS — N179 Acute kidney failure, unspecified: Secondary | ICD-10-CM | POA: Diagnosis not present

## 2015-06-03 DIAGNOSIS — R600 Localized edema: Secondary | ICD-10-CM | POA: Diagnosis not present

## 2015-06-03 DIAGNOSIS — R509 Fever, unspecified: Secondary | ICD-10-CM | POA: Diagnosis not present

## 2015-06-03 DIAGNOSIS — Z7901 Long term (current) use of anticoagulants: Secondary | ICD-10-CM | POA: Diagnosis not present

## 2015-06-03 DIAGNOSIS — I10 Essential (primary) hypertension: Secondary | ICD-10-CM | POA: Diagnosis not present

## 2015-06-03 DIAGNOSIS — Z86718 Personal history of other venous thrombosis and embolism: Secondary | ICD-10-CM | POA: Diagnosis not present

## 2015-06-03 DIAGNOSIS — S0001XA Abrasion of scalp, initial encounter: Secondary | ICD-10-CM | POA: Diagnosis not present

## 2015-06-03 DIAGNOSIS — S0190XA Unspecified open wound of unspecified part of head, initial encounter: Secondary | ICD-10-CM | POA: Diagnosis not present

## 2015-06-03 DIAGNOSIS — D72829 Elevated white blood cell count, unspecified: Secondary | ICD-10-CM | POA: Diagnosis not present

## 2015-06-03 MED ORDER — LEVOFLOXACIN 750 MG PO TABS: 750.0000 mg | ORAL_TABLET | Freq: Every day | ORAL | Status: DC

## 2015-06-03 NOTE — Clinical Social Work Placement (Signed)
   CLINICAL SOCIAL WORK PLACEMENT  NOTE  Date:  06/03/2015  Patient Details  Name: Jared Kim MRN: [id number] Date of Birth: 09/08/1927  Clinical Social Work is seeking post-discharge placement for this patient at the [place] level of care (*CSW will initial, date and re-position this form in  chart as items are completed):  Yes   Patient/family provided with [place] Work Department's list of facilities offering this level of care within the geographic area requested by the patient (or if unable, by the patient's family).  Yes   Patient/family informed of their freedom to choose among providers that offer the needed level of care, that participate in Medicare, Medicaid or managed care program needed by the patient, have an available bed and are willing to accept the patient.  Yes   Patient/family informed of [place]'s ownership interest in [facility] and [facility], as well as of the fact that they are under no obligation to receive care at these facilities.  PASRR submitted to EDS on 06/01/15     PASRR number received on 06/01/15     Existing PASRR number confirmed on       FL2 transmitted to all facilities in geographic area requested by pt/family on 06/01/15     FL2 transmitted to all facilities within larger geographic area on 06/01/15 ([place])     Patient informed that his/her managed care company has contracts with or will negotiate with certain facilities, including the following:   (NA)         Patient/family informed of bed offers received.  Patient chooses bed at [place] [place]     Physician recommends and patient chooses bed at      Patient to be transferred to [place], [place] on  . 06/03/2015  Patient to be transferred to facility by Ambulance [facility])     Patient family notified on 06/03/15 of transfer.  Name of family member notified:  Tory Emerald- Daughter     PHYSICIAN Please sign  FL2, Please prepare priority discharge summary, including medications, Please sign DNR, Please prepare prescriptions     Additional Comment: Ok per MD for d/c today to SNF for rehab. Daughter and patient are aware and agreeable to d/c. Daughter will go to the SNF to sign admit papers; Admissions Director at [facility] is aware. DC summary sent to facility and packet prepared. Nursing to call report. CSW signing off.  Lorie Phenix. Pauline Good, [place] (coverage)      _______________________________________________ Williemae Area, LCSW 06/03/2015, 2:57 PM

## 2015-06-03 NOTE — Discharge Summary (Addendum)
Physician Discharge Summary  Jared Kim ZOX:[id number] DOB: Aug 18, 1927 DOA: 05/30/2015  PCP: Vidal Schwalbe, MD  Admit date: 05/30/2015 Discharge date: 06/03/2015  Time spent: 30 minutes  Recommendations for Outpatient Follow-up:  1. Discharge to SNF  Completes valtrex on 8/23. 2. Please follow renal function in 3-4 days.  3. If patient's right shoulder still has limited ROM and weakness in 1 week , he needs a CT or MRI to r/o any fracture or tissue injury 4. 4. Recheck cbc, cmp in 3-5 days   Discharge Diagnoses:   Principal Problem:   AKI (acute kidney injury)  Active Problems:   Essential hypertension   Elevated troponin   Shingles   Severe malnutrition   Secondary cardiomyopathy   Right anterior shoulder pain   Coronary artery diease Fever likely secondary to shingles and secondary skin infection   Discharge Condition: fair  Diet recommendation: heart health with supplements  Filed Weights   06/01/15 0500 06/02/15 0610 06/03/15 0514  Weight: 81.557 kg (179 lb 12.8 oz) 79.969 kg (176 lb 4.8 oz) 80.423 kg (177 lb 4.8 oz)     Medication List    TAKE these medications        atorvastatin 40 MG tablet  Commonly known as:  LIPITOR  Take 40 mg by mouth at bedtime.     docusate sodium 100 MG capsule  Commonly known as:  COLACE  Take 100 mg by mouth daily.     feeding supplement (ENSURE ENLIVE) Liqd  Take 237 mLs by mouth 2 (two) times daily between meals.     ferrous sulfate 325 (65 FE) MG tablet  Take 325 mg by mouth daily.     levofloxacin 750 MG tablet  Commonly known as:  LEVAQUIN  Take 1 tablet (750 mg total) by mouth daily.     lisinopril 10 MG tablet  Commonly known as:  PRINIVIL,ZESTRIL  Take 10 mg by mouth daily.     metoprolol succinate 25 MG 24 hr tablet  Commonly known as:  TOPROL-XL  Take 12.5 mg by mouth daily.     montelukast 10 MG tablet  Commonly known as:  SINGULAIR  Take 10 mg by mouth daily.     tamsulosin 0.4 MG Caps capsule   Commonly known as:  FLOMAX  Take 0.4 mg by mouth daily.     traMADol-acetaminophen 37.5-325 MG per tablet  Commonly known as:  ULTRACET  Take 1 tablet by mouth every 6 (six) hours as needed for moderate pain.     valACYclovir 1000 MG tablet  Commonly known as:  VALTREX  Take 1 tablet (1,000 mg total) by mouth 2 (two) times daily.     XARELTO 20 MG Tabs tablet  Generic drug:  rivaroxaban  Take 20 mg by mouth daily.        History of present illness:  81 year old male with history of essential hypertension, DVT on Xarelto (unknown duration), coronary artery disease, GERD, [place] which was mechanical in nature and did not lose consciousness. He sustained injury to his left arm and shoulder. Since then his family noted patient had poor appetite for by mouth intake and completing of right arm pain. No history of fever, chills, nausea, vomiting, chest pain, palpitations or shortness of breath, abdominal pain, bowel or urinary symptoms. Patient also having a rash on his right cheek and nose consistent with shingles.. In the ED vitals were stable. Blood work showed leukocytosis, hemoglobin of 11.4,  mild hyponatremia with sodium of 130, hypokalemia (2.1) acute kidney injury with creatinine 1.5 and mildly elevated troponin. Patient admitted to telemetry.  Hospital Course:  Acute kidney injury Appears to be prerenal with poor by mouth intake for past 10 days. Patient also taking lisinopril. His lisinopril was held and received gentle IV hydration. Renal function has improved to baseline. UA negative for UTI.  Fever, chest x-ray, UA negative patient did have red blood cells in his urine too numerous to count, patient is on anticoagulation Would recommend PCP to consider urology consult and a follow-up UA Most likely the fever is secondary to secondary skin infection due to shingles, and fever has resolved after the patient was started on Levaquin, will  continue for another 7 days  Hyponatremia/hypokalemia Replenished low potassium. Monitor Na as outpt  generalized weakness with fall and right arm /shoulder pain . X-ray of the right arm and shoulder without any fracture or acute injury. Pain control with when necessary Ultracet. Patient able to move his arm better today. Please monitor with PT at SNF. If still has pain and weakness of rt arm and shoulder in 1 week, he will need a CT or MRI of his shoulder to r/o occult fracute.   Elevated troponin level history of cardiomyopathy Troponin peaked 0.10. EKG shows LBBB . No chest pain, shortness of breath or palpitation. Possibly demand ischemia.has underlying cardiomyopathy with EF of 30-35%. Cardiology consulted. Patient apparently had 2-D echo done at [facility] in March with similar EF and recommend this to be chronic and no further cardiac workup recommended. On low-dose beta blocker. Resume lisinopril on discharge.    Essential hypertension Blood pressure stable.   History of DVT On Xarelto which has been continued. No clear duration of anticoagulation.( prescribed by his PCP)  Shingles on the face on Valtrex. Improved. Will treat for total 10 dyas  CAD Continue Lipitor and metoprolol.  Anemia Possibly iron deficiency. Is on iron supplements at home. Hemoglobin appears at baseline from 2 years ago.  Severe malnutrition. Appreciate nutrition evaluation and added supplements.  Diet: Heart healthy   Code Status: Full code Family Communication: Updated daughter on the phone on 8/15 Disposition Plan: PT evaluation recommends SNF.Marland Kitchen    Consultants:  cardiology  Procedures:  2-D echo  Antibiotics:  None    Discharge Exam: Filed Vitals:   06/03/15 0514  BP: 126/57  Pulse: 86  Temp: 98.9 F (37.2 C)  Resp: 16     General: elderly thin built male in no acute distress  HEENT: Shingles lower right cheek and nose( improved since admission, no open lesions) , moist  oral mucosa  Chest: Clear to auscultation bilaterally  CVS: S1 and S2 regular, no murmurs rub or gallop  GI: Soft, nondistended, nontender, bowel sounds present  Musculoskeletal: Limited mobility  of right shoulder (Improved since admission) Chronic skin changes and varicose veins, no edema  CNS: Alert and oriented  Discharge Instructions   Discharge Instructions    Diet - low sodium heart healthy    Complete by:  As directed      Increase activity slowly    Complete by:  As directed            No Known Allergies Follow-up Information    Please follow up.   Why:  MD at SNF      Follow up with [place] [facility].   Why:  Physical / Occupational Therapy, Social Worker, Proofreader information:   [phone]  [place] [address] [phone]       Follow up with Vidal Schwalbe, MD. Schedule an appointment as soon as possible for a visit in 1 week.   Specialty:  Family Medicine   Contact information:   [place] [address] [phone]        The results of significant diagnostics from this hospitalization (including imaging, microbiology, ancillary and laboratory) are listed below for reference.    Significant Diagnostic Studies: Dg Shoulder Right  05/30/2015   CLINICAL DATA:  81 year old male with history of fall 10 days ago complaining of pain in the right shoulder unable to lift the right arm.  EXAM: RIGHT SHOULDER - 2+ VIEW  COMPARISON:  No priors.  FINDINGS: Large subacromial spur. Degenerative changes of osteoarthritis at the acromioclavicular and glenohumeral joints. No acute displaced fracture, subluxation or dislocation.  IMPRESSION: 1. No acute radiographic abnormality of the right shoulder. 2. Degenerative changes of osteoarthritis in the glenohumeral and acromioclavicular joints. 3. Large subacromial spur.   Electronically Signed   By: Vinnie Langton M.D.   On: 05/30/2015 14:33   Dg Elbow  Complete Right  05/30/2015   CLINICAL DATA:  Status post fall 10 days ago.  Right arm pain.  EXAM: RIGHT ELBOW - COMPLETE 3+ VIEW  COMPARISON:  None.  FINDINGS: Small calcification along the right radial head. There is no evidence of fracture, dislocation, or joint effusion. There is no evidence of arthropathy or other focal bone abnormality. Soft tissues are unremarkable.  IMPRESSION: 1. No acute fracture or dislocation of the right elbow. 2. Calcification along the right radial head which may be within the soft tissues and are likely dystrophic. The appearance is atypical for an avulsive injury.   Electronically Signed   By: Kathreen Devoid   On: 05/30/2015 14:45   Dg Forearm Right  05/30/2015   CLINICAL DATA:  Fall, pain  EXAM: RIGHT FOREARM - 2 VIEW  COMPARISON:  Right elbow same day  FINDINGS: Two views of the right forearm submitted. No acute fracture or subluxation. No radiopaque foreign body.  IMPRESSION: Negative.   Electronically Signed   By: Lahoma Crocker M.D.   On: 05/30/2015 15:33   Ct Head Wo Contrast  05/30/2015   CLINICAL DATA:  81 year old male with history of trauma from a fall 10 days ago. Headache. Unable to lift right arm. Patient takes the Xarelto for history of DVT.  EXAM: CT HEAD WITHOUT CONTRAST  TECHNIQUE: Contiguous axial images were obtained from the base of the skull through the vertex without intravenous contrast.  COMPARISON:  Head CT 10/23/2012.  FINDINGS: Moderate cerebral and mild cerebellar atrophy. Patchy and confluent areas of decreased attenuation are noted throughout the deep and periventricular white matter of the cerebral hemispheres bilaterally, compatible with chronic microvascular ischemic disease. No acute displaced skull fractures are identified. No acute intracranial abnormality. Specifically, no evidence of acute post-traumatic intracranial hemorrhage, no definite regions of acute/subacute cerebral ischemia, no focal mass, mass effect, hydrocephalus or abnormal intra  or extra-axial fluid collections. The visualized paranasal sinuses and mastoids are well pneumatized.  IMPRESSION: 1. No signs of significant acute traumatic injury to the skull or brain. 2. Moderate cerebral and mild cerebellar atrophy with extensive chronic microvascular ischemic changes in the cerebral white matter redemonstrated, as above.   Electronically Signed   By: Vinnie Langton M.D.   On: 05/30/2015 14:41   Ct Cervical Spine Wo Contrast  05/30/2015   CLINICAL  DATA:  81 year old male with history of trauma from a fall 10 days ago complaining of neck pain. Patient takes Xarelto for DVT.  EXAM: CT CERVICAL SPINE WITHOUT CONTRAST  TECHNIQUE: Multidetector CT imaging of the cervical spine was performed without intravenous contrast. Multiplanar CT image reconstructions were also generated.  COMPARISON:  No priors.  FINDINGS: No acute displaced fracture of the cervical spine. Alignment is anatomic. Prevertebral soft tissues are normal. Multilevel degenerative disc disease, most severe at C5-C6 and T1-T2. Multilevel facet arthropathy. Visualized portions of the upper thorax are unremarkable.  IMPRESSION: 1. No signs of significant acute traumatic injury to the cervical spine. 2. Multilevel degenerative disc disease and cervical spondylosis, as above.   Electronically Signed   By: Vinnie Langton M.D.   On: 05/30/2015 15:20   Dg Chest Port 1 View  06/02/2015   CLINICAL DATA:  Fevers  EXAM: PORTABLE CHEST - 1 VIEW  COMPARISON:  None.  FINDINGS: The heart size and mediastinal contours are within normal limits. Both lungs are clear. The visualized skeletal structures are unremarkable.  IMPRESSION: No active disease.   Electronically Signed   By: Inez Catalina M.D.   On: 06/02/2015 14:06   Dg Humerus Right  05/30/2015   CLINICAL DATA:  81 year old male status post fall with right arm pain. Initial encounter.  EXAM: RIGHT HUMERUS - 2+ VIEW  COMPARISON:  Right elbow series and shoulder series from today  reported separately.  FINDINGS: Grossly normal alignment about the right shoulder and elbow. Osteopenia. No acute right humerus fracture. Visible right ribs also appear intact.  IMPRESSION: No acute fracture or dislocation identified about the right humerus.   Electronically Signed   By: Genevie Ann M.D.   On: 05/30/2015 15:35    Microbiology: No results found for this or any previous visit (from the past 240 hour(s)).   Labs: Basic Metabolic Panel:  Recent Labs Lab 05/30/15 1330 05/31/15 0258 05/31/15 0946 06/01/15 1219  NA 130* 130*  --  130*  K 3.7 3.1*  --  4.0  CL 97* 101  --  99*  CO2 22 21*  --  24  GLUCOSE 118* 120*  --  111*  BUN 26* 20  --  11  CREATININE 1.50* 1.19  --  0.97  CALCIUM 9.0 8.2*  --  8.5*  MG  --   --  2.0  --    Liver Function Tests:  Recent Labs Lab 05/30/15 1330  AST 42*  ALT 21  ALKPHOS 96  BILITOT 1.1  PROT 7.4  ALBUMIN 2.8*   No results for input(s): LIPASE, AMYLASE in the last 168 hours. No results for input(s): AMMONIA in the last 168 hours. CBC:  Recent Labs Lab 05/30/15 1330 05/31/15 0258 06/02/15 1800  WBC 13.5* 12.8* 14.4*  HGB 11.4* 10.3* 10.6*  HCT 32.9* 30.0* 31.0*  MCV 85.2 85.7 85.9  PLT 246 208 249   Cardiac Enzymes:  Recent Labs Lab 05/30/15 1330 05/30/15 1703 05/30/15 2132 05/31/15 0258 05/31/15 0946  TROPONINI 0.04* 0.06* 0.09* 0.10* 0.08*   BNP: BNP (last 3 results) No results for input(s): BNP in the last 8760 hours.  ProBNP (last 3 results) No results for input(s): PROBNP in the last 8760 hours.  CBG: No results for input(s): GLUCAP in the last 168 hours.     SignedReyne Dumas  [facility] 06/03/2015, 1:25 PM

## 2015-06-04 DIAGNOSIS — E41 Nutritional marasmus: Secondary | ICD-10-CM | POA: Diagnosis not present

## 2015-06-04 DIAGNOSIS — N179 Acute kidney failure, unspecified: Secondary | ICD-10-CM | POA: Diagnosis not present

## 2015-06-04 DIAGNOSIS — I10 Essential (primary) hypertension: Secondary | ICD-10-CM | POA: Diagnosis not present

## 2015-06-04 DIAGNOSIS — I82409 Acute embolism and thrombosis of unspecified deep veins of unspecified lower extremity: Secondary | ICD-10-CM | POA: Diagnosis not present

## 2015-06-05 ENCOUNTER — Other Ambulatory Visit (HOSPITAL_COMMUNITY): Payer: Self-pay | Admitting: Internal Medicine

## 2015-06-05 DIAGNOSIS — Z1231 Encounter for screening mammogram for malignant neoplasm of breast: Secondary | ICD-10-CM

## 2015-06-05 DIAGNOSIS — M25521 Pain in right elbow: Secondary | ICD-10-CM

## 2015-06-05 LAB — CULTURE, BLOOD (ROUTINE X 2)

## 2015-06-07 LAB — CULTURE, BLOOD (ROUTINE X 2): Culture: NO GROWTH

## 2015-06-08 ENCOUNTER — Emergency Department (HOSPITAL_COMMUNITY): Payer: Medicare Other

## 2015-06-08 ENCOUNTER — Emergency Department (HOSPITAL_COMMUNITY)
Admission: EM | Admit: 2015-06-08 | Discharge: 2015-06-08 | Disposition: A | Discharge status: Home / Self Care | Payer: Medicare Other | Attending: Emergency Medicine | Admitting: Emergency Medicine

## 2015-06-08 ENCOUNTER — Encounter (HOSPITAL_COMMUNITY): Payer: Self-pay | Admitting: Emergency Medicine

## 2015-06-08 DIAGNOSIS — M25511 Pain in right shoulder: Secondary | ICD-10-CM | POA: Diagnosis not present

## 2015-06-08 DIAGNOSIS — S0001XA Abrasion of scalp, initial encounter: Secondary | ICD-10-CM

## 2015-06-08 DIAGNOSIS — Y9389 Activity, other specified: Secondary | ICD-10-CM | POA: Insufficient documentation

## 2015-06-08 DIAGNOSIS — Z8719 Personal history of other diseases of the digestive system: Secondary | ICD-10-CM | POA: Insufficient documentation

## 2015-06-08 DIAGNOSIS — S1191XA Laceration without foreign body of unspecified part of neck, initial encounter: Secondary | ICD-10-CM | POA: Diagnosis not present

## 2015-06-08 DIAGNOSIS — Z23 Encounter for immunization: Secondary | ICD-10-CM | POA: Insufficient documentation

## 2015-06-08 DIAGNOSIS — Z86718 Personal history of other venous thrombosis and embolism: Secondary | ICD-10-CM | POA: Insufficient documentation

## 2015-06-08 DIAGNOSIS — Y998 Other external cause status: Secondary | ICD-10-CM | POA: Insufficient documentation

## 2015-06-08 DIAGNOSIS — E785 Hyperlipidemia, unspecified: Secondary | ICD-10-CM | POA: Diagnosis not present

## 2015-06-08 DIAGNOSIS — I10 Essential (primary) hypertension: Secondary | ICD-10-CM | POA: Insufficient documentation

## 2015-06-08 DIAGNOSIS — W1839XA Other fall on same level, initial encounter: Secondary | ICD-10-CM | POA: Insufficient documentation

## 2015-06-08 DIAGNOSIS — I2510 Atherosclerotic heart disease of native coronary artery without angina pectoris: Secondary | ICD-10-CM | POA: Diagnosis not present

## 2015-06-08 DIAGNOSIS — Z79899 Other long term (current) drug therapy: Secondary | ICD-10-CM | POA: Insufficient documentation

## 2015-06-08 DIAGNOSIS — Y92129 Unspecified place in nursing home as the place of occurrence of the external cause: Secondary | ICD-10-CM | POA: Insufficient documentation

## 2015-06-08 DIAGNOSIS — S0990XA Unspecified injury of head, initial encounter: Secondary | ICD-10-CM | POA: Diagnosis not present

## 2015-06-08 DIAGNOSIS — W19XXXA Unspecified fall, initial encounter: Secondary | ICD-10-CM

## 2015-06-08 LAB — CBC WITH DIFFERENTIAL/PLATELET
BASOS ABS: 0 10*3/uL (ref 0.0–0.1)
Basophils Relative: 0 % (ref 0–1)
EOS ABS: 0 10*3/uL (ref 0.0–0.7)
Eosinophils Relative: 0 % (ref 0–5)
HEMATOCRIT: 30.2 % — AB (ref 39.0–52.0)
HEMOGLOBIN: 10.4 g/dL — AB (ref 13.0–17.0)
LYMPHS PCT: 13 % (ref 12–46)
Lymphs Abs: 1.8 10*3/uL (ref 0.7–4.0)
MCH: 29.6 pg (ref 26.0–34.0)
MCHC: 34.4 g/dL (ref 30.0–36.0)
MCV: 86 fL (ref 78.0–100.0)
MONO ABS: 1.4 10*3/uL — AB (ref 0.1–1.0)
Monocytes Relative: 10 % (ref 3–12)
NEUTROS PCT: 77 % (ref 43–77)
Neutro Abs: 10.6 10*3/uL — ABNORMAL HIGH (ref 1.7–7.7)
Platelets: 314 10*3/uL (ref 150–400)
RBC: 3.51 MIL/uL — AB (ref 4.22–5.81)
RDW: 14 % (ref 11.5–15.5)
WBC: 13.8 10*3/uL — AB (ref 4.0–10.5)

## 2015-06-08 LAB — URINE CULTURE

## 2015-06-08 LAB — BASIC METABOLIC PANEL
ANION GAP: 8 (ref 5–15)
BUN: 23 mg/dL — ABNORMAL HIGH (ref 6–20)
CHLORIDE: 97 mmol/L — AB (ref 101–111)
CO2: 21 mmol/L — AB (ref 22–32)
Calcium: 8.1 mg/dL — ABNORMAL LOW (ref 8.9–10.3)
Creatinine, Ser: 1.16 mg/dL (ref 0.61–1.24)
GFR calc non Af Amer: 55 mL/min — ABNORMAL LOW (ref >60)
Glucose, Bld: 166 mg/dL — ABNORMAL HIGH (ref 65–99)
Potassium: 4.4 mmol/L (ref 3.5–5.1)
SODIUM: 126 mmol/L — AB (ref 135–145)

## 2015-06-08 LAB — PROTIME-INR
INR: 2.77 — AB (ref 0.00–1.49)
Prothrombin Time: 28.9 seconds — ABNORMAL HIGH (ref 11.6–15.2)

## 2015-06-08 LAB — APTT: APTT: 57 s — AB (ref 24–37)

## 2015-06-08 MED ORDER — TETANUS-DIPHTH-ACELL PERTUSSIS 5-2.5-18.5 LF-MCG/0.5 IM SUSP: 0.5000 mL | Freq: Once | INTRAMUSCULAR | Status: DC

## 2015-06-08 MED ORDER — TETANUS-DIPHTH-ACELL PERTUSSIS 5-2.5-18.5 LF-MCG/0.5 IM SUSP
0.5000 mL | Freq: Once | INTRAMUSCULAR | Status: AC
  Administered 2015-06-08: 0.5 mL via INTRAMUSCULAR
  Filled 2015-06-08: qty 0.5

## 2015-06-08 NOTE — ED Notes (Signed)
Pressure dressing applied over Quik Clot Combat Gauze and additional 4x4 gauze pads. Patient tolerated well. Bleeding stopped.

## 2015-06-08 NOTE — ED Provider Notes (Signed)
CSN: [id number]     Arrival date & time 06/08/15  0321 History  This chart was scribed for Everlene Balls, MD by Ludger Nutting, ED Scribe. This patient was seen in room A08C/A08C and the patient's care was started 3:41 AM.    Chief Complaint  Patient presents with  . Fall  . Head Laceration   The history is provided by the patient. The history is limited by the condition of the patient. No language interpreter was used.     LEVEL 5 CAVEAT: Poor Historian HPI Comments: Jared Kim is a 81 y.o. male from nursing home who presents to the Emergency Department complaining of a fall that occurred PTA. Patient is unable to describe how the fall occurred but presents with a laceration to the left scalp. He currently takes Xarelto.   Past Medical History  Diagnosis Date  . Coronary artery disease   . Hypertension   . High cholesterol   . GERD (gastroesophageal reflux disease)   . DVT (deep venous thrombosis)    Past Surgical History  Procedure Laterality Date  . Appendectomy    . Prostate surgery      Family History  Problem Relation Age of Onset  . Cancer - Other Mother   . Heart attack Father    Social History  Substance Use Topics  . Smoking status: Never Smoker   . Smokeless tobacco: Never Used  . Alcohol Use: No    Review of Systems  Unable to perform ROS: Other   Allergies  Review of patient's allergies indicates no known allergies.  Home Medications   Prior to Admission medications   Medication Sig Start Date End Date Taking? Authorizing Provider  atorvastatin (LIPITOR) 40 MG tablet Take 40 mg by mouth at bedtime.    Historical Provider, MD  docusate sodium (COLACE) 100 MG capsule Take 100 mg by mouth daily.    Historical Provider, MD  feeding supplement, ENSURE ENLIVE, (ENSURE ENLIVE) LIQD Take 237 mLs by mouth 2 (two) times daily between meals. 06/02/15   Nishant Dhungel, MD  ferrous sulfate 325 (65 FE) MG tablet Take 325 mg by mouth daily.    Historical Provider, MD   levofloxacin (LEVAQUIN) 750 MG tablet Take 1 tablet (750 mg total) by mouth daily. 06/03/15   Reyne Dumas, MD  lisinopril (PRINIVIL,ZESTRIL) 10 MG tablet Take 10 mg by mouth daily.    Historical Provider, MD  metoprolol succinate (TOPROL-XL) 25 MG 24 hr tablet Take 12.5 mg by mouth daily. 05/21/15 05/20/16  Historical Provider, MD  montelukast (SINGULAIR) 10 MG tablet Take 10 mg by mouth daily.    Historical Provider, MD  Tamsulosin HCl (FLOMAX) 0.4 MG CAPS Take 0.4 mg by mouth daily.    Historical Provider, MD  traMADol-acetaminophen (ULTRACET) 37.5-325 MG per tablet Take 1 tablet by mouth every 6 (six) hours as needed for moderate pain. 06/02/15   Nishant Dhungel, MD  valACYclovir (VALTREX) 1000 MG tablet Take 1 tablet (1,000 mg total) by mouth 2 (two) times daily. 06/02/15 06/09/15  Nishant Dhungel, MD  XARELTO 20 MG TABS tablet Take 20 mg by mouth daily. 05/21/15   Historical Provider, MD   There were no vitals taken for this visit. Physical Exam  Constitutional: He is oriented to person, place, and time. Vital signs are normal. He appears well-developed and well-nourished.  Non-toxic appearance. He does not appear ill. No distress. Cervical collar in place.  HENT:  Head: Normocephalic.  Nose: Nose normal.  Mouth/Throat: Oropharynx is clear and  moist. No oropharyngeal exudate.  Significant abrasion to the left frontotemporal skull with active venous oozing.   Eyes: Conjunctivae and EOM are normal. Pupils are equal, round, and reactive to light. No scleral icterus.  Neck: Normal range of motion. Neck supple. No tracheal deviation, no edema, no erythema and normal range of motion present. No thyroid mass and no thyromegaly present.  C-collar in place   Cardiovascular: Normal rate, regular rhythm, S1 normal, S2 normal, normal heart sounds, intact distal pulses and normal pulses.  Exam reveals no gallop and no friction rub.   No murmur heard. Pulses:      Radial pulses are 2+ on the right side, and  2+ on the left side.       Dorsalis pedis pulses are 2+ on the right side, and 2+ on the left side.  Pulmonary/Chest: Effort normal and breath sounds normal. No respiratory distress. He has no wheezes. He has no rhonchi. He has no rales.  Abdominal: Soft. Normal appearance and bowel sounds are normal. He exhibits no distension, no ascites and no mass. There is no hepatosplenomegaly. There is no tenderness. There is no rebound, no guarding and no CVA tenderness.  Musculoskeletal: He exhibits no edema or tenderness.  RUE in sling   Lymphadenopathy:    He has no cervical adenopathy.  Neurological: He is alert and oriented to person, place, and time. He has normal strength. No cranial nerve deficit or sensory deficit. He exhibits normal muscle tone.  Normal strength and sensation in all extremities.    Skin: Skin is warm, dry and intact. No petechiae and no rash noted. He is not diaphoretic. No erythema. No pallor.  Psychiatric: He has a normal mood and affect. His behavior is normal. Judgment normal.  Nursing note and vitals reviewed.   ED Course  Procedures (including critical care time)  DIAGNOSTIC STUDIES: Oxygen Saturation is 100% on RA, normal by my interpretation.    COORDINATION OF CARE: 3:47 AM Discussed treatment plan with pt at bedside and pt agreed to plan.   Labs Review Labs Reviewed  PROTIME-INR - Abnormal; Notable for the following:    Prothrombin Time 28.9 (*)    INR 2.77 (*)    All other components within normal limits  APTT - Abnormal; Notable for the following:    aPTT 57 (*)    All other components within normal limits  CBC WITH DIFFERENTIAL/PLATELET - Abnormal; Notable for the following:    WBC 13.8 (*)    RBC 3.51 (*)    Hemoglobin 10.4 (*)    HCT 30.2 (*)    Neutro Abs 10.6 (*)    Monocytes Absolute 1.4 (*)    All other components within normal limits  BASIC METABOLIC PANEL - Abnormal; Notable for the following:    Sodium 126 (*)    Chloride 97 (*)     CO2 21 (*)    Glucose, Bld 166 (*)    BUN 23 (*)    Calcium 8.1 (*)    GFR calc non Af Amer 55 (*)    All other components within normal limits    Imaging Review Dg Shoulder Right  06/08/2015   CLINICAL DATA:  Right shoulder pain after unwitnessed fall.  EXAM: RIGHT SHOULDER - 2+ VIEW  COMPARISON:  05/30/2015  FINDINGS: No fracture or dislocation. 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 Subacromial spur is again seen. No abnormal soft tissue calcifications.  IMPRESSION: No fracture or dislocation of the right shoulder.  Electronically Signed   By: Jeb Levering M.D.   On: 06/08/2015 04:33   Ct Head Wo Contrast  06/08/2015   CLINICAL DATA:  Unwitnessed fall 2 hours prior. Laceration to right side of neck and back of head. Patient on Xarelto.  EXAM: CT HEAD WITHOUT CONTRAST  CT CERVICAL SPINE WITHOUT CONTRAST  TECHNIQUE: Multidetector CT imaging of the head and cervical spine was performed following the standard protocol without intravenous contrast. Multiplanar CT image reconstructions of the cervical spine were also generated.  COMPARISON:  Head and cervical spine CT 05/30/2015  FINDINGS: CT HEAD FINDINGS  Generalized atrophy and chronic small vessel ischemic change, stable from prior. No intracranial hemorrhage, mass effect, or midline shift. No hydrocephalus. The basilar cisterns are patent. No evidence of territorial infarct. No intracranial fluid collection. Left frontal scalp injury, no radiopaque foreign body. Calvarium is intact. Included paranasal sinuses and mastoid air cells are well aerated.  CT CERVICAL SPINE FINDINGS  Cervical spine alignment is maintained. Vertebral body heights are preserved. There is no fracture. The dens is intact. There are no jumped or perched facets. Multilevel degenerative disc disease and facet arthropathy, unchanged from prior exam. No prevertebral soft tissue edema.  IMPRESSION: 1. Stable chronic change without acute intracranial  abnormality. 2. Stable degenerative change in the cervical spine without acute fracture or subluxation.   Electronically Signed   By: Jeb Levering M.D.   On: 06/08/2015 05:05   Ct Cervical Spine Wo Contrast  06/08/2015   CLINICAL DATA:  Unwitnessed fall 2 hours prior. Laceration to right side of neck and back of head. Patient on Xarelto.  EXAM: CT HEAD WITHOUT CONTRAST  CT CERVICAL SPINE WITHOUT CONTRAST  TECHNIQUE: Multidetector CT imaging of the head and cervical spine was performed following the standard protocol without intravenous contrast. Multiplanar CT image reconstructions of the cervical spine were also generated.  COMPARISON:  Head and cervical spine CT 05/30/2015  FINDINGS: CT HEAD FINDINGS  Generalized atrophy and chronic small vessel ischemic change, stable from prior. No intracranial hemorrhage, mass effect, or midline shift. No hydrocephalus. The basilar cisterns are patent. No evidence of territorial infarct. No intracranial fluid collection. Left frontal scalp injury, no radiopaque foreign body. Calvarium is intact. Included paranasal sinuses and mastoid air cells are well aerated.  CT CERVICAL SPINE FINDINGS  Cervical spine alignment is maintained. Vertebral body heights are preserved. There is no fracture. The dens is intact. There are no jumped or perched facets. Multilevel degenerative disc disease and facet arthropathy, unchanged from prior exam. No prevertebral soft tissue edema.  IMPRESSION: 1. Stable chronic change without acute intracranial abnormality. 2. Stable degenerative change in the cervical spine without acute fracture or subluxation.   Electronically Signed   By: Jeb Levering M.D.   On: 06/08/2015 05:05   I have personally reviewed and evaluated these images and lab results as part of my medical decision-making.   EKG Interpretation None      MDM   Final diagnoses:  None    Patient presents to the emergency department after an unwitnessed fall. He is  unable to give his own history. There is mild bleeding at his scalp abrasion. There is no laceration to suture back together or you staples. Patient was given a CT scan which does not show any intracranial or cervical injury. Cervical collar was cleared. Fall was unwitnessed and ED workup does not reveal a cause for the fall. Patient states dressing was applied tightly to stop the bleeding. Tetanus shot  was updated. At this time the patient appears well and in no acute distress. Pressure dressing stopped the bleeding and regular bandage applied with combat gauze.  Tachycardia resolved without intervention.  He is safe for discharge back to his nursing facility.  tylenol as needed for pain control.  I personally performed the services described in this documentation, which was scribed in my presence. The recorded information has been reviewed and is accurate.   Everlene Balls, MD 06/08/15 [phone]

## 2015-06-08 NOTE — ED Notes (Signed)
Patient transported to CT 

## 2015-06-08 NOTE — ED Notes (Signed)
Patient arrived via [facility]. EMS reports that patient is a resident of [facility] for rehab, and that he had an unwitnessed fall at approx 0230. Patient is taking Xarelto. Patient is in [place]. Avulsion reported on top left portion of head which is covered with gauze. EMS reports bleeding which required lots of pressure to stop. Small laceration on right side of neck. Patient is wearing a sling on R arm. BP 130's/80's, 110 - pulse, 172 CBG. LBBB on EKG. 18 gauge in L forearm. Patient denies pain.

## 2015-06-08 NOTE — ED Notes (Signed)
Patient returned from CT via stretcher.

## 2015-06-08 NOTE — Discharge Instructions (Signed)
Fall Prevention and Home Safety Jared Kim, your CT scan did not show any significant injuries.  Keep a dressing on the area to prevent further injury.  See a primary care doctor within 3 days for close follow up and wound check.  If symptoms worsen, come back to the ED immediately.  Thank you. Falls cause injuries and can affect all age groups. It is possible to prevent falls.  HOW TO PREVENT FALLS  Wear shoes with rubber soles that do not have an opening for your toes.  Keep the inside and outside of your house well lit.  Use night lights throughout your home.  Remove clutter from floors.  Clean up floor spills.  Remove throw rugs or fasten them to the floor with carpet tape.  Do not place electrical cords across pathways.  Put grab bars by your tub, shower, and toilet. Do not use towel bars as grab bars.  Put handrails on both sides of the stairway. Fix loose handrails.  Do not climb on stools or stepladders, if possible.  Do not wax your floors.  Repair uneven or unsafe sidewalks, walkways, or stairs.  Keep items you use a lot within reach.  Be aware of pets.  Keep emergency numbers next to the telephone.  Put smoke detectors in your home and near bedrooms. Ask your doctor what other things you can do to prevent falls. Document Released: 07/30/2009 Document Revised: 04/03/2012 Document Reviewed: 01/03/2012 [facility] Patient Information 2015 [place], [place]. This information is not intended to replace advice given to you by your health care provider. Make sure you discuss any questions you have with your health care provider. Facial or Scalp Contusion  A facial or scalp contusion is a deep bruise on the face or head. Contusions happen when an injury causes bleeding under the skin. Signs of bruising include pain, puffiness (swelling), and discolored skin. The contusion may turn blue, purple, or yellow. HOME CARE  Only take medicines as told by your doctor.  Put ice on  the injured area.  Put ice in a plastic bag.  Place a towel between your skin and the bag.  Leave the ice on for 20 minutes, 2-3 times a day. GET HELP IF:  You have bite problems.  You have pain when chewing.  You are worried about your face not healing normally. GET HELP RIGHT AWAY IF:   You have severe pain or a headache and medicine does not help.  You are very tired or confused, or your personality changes.  You throw up (vomit).  You have a nosebleed that will not stop.  You see two of everything (double vision) or have blurry vision.  You have fluid coming from your nose or ear.  You have problems walking or using your arms or legs. MAKE SURE YOU:   Understand these instructions.  Will watch your condition.  Will get help right away if you are not doing well or get worse. Document Released: 09/22/2011 Document Revised: 07/24/2013 Document Reviewed: 05/16/2013 [employer] Patient Information 2015 [place], [place]. This information is not intended to replace advice given to you by your health care provider. Make sure you discuss any questions you have with your health care provider.

## 2015-06-09 DIAGNOSIS — R410 Disorientation, unspecified: Secondary | ICD-10-CM | POA: Diagnosis not present

## 2015-06-09 DIAGNOSIS — D72829 Elevated white blood cell count, unspecified: Secondary | ICD-10-CM | POA: Diagnosis not present

## 2015-06-09 DIAGNOSIS — I82409 Acute embolism and thrombosis of unspecified deep veins of unspecified lower extremity: Secondary | ICD-10-CM | POA: Diagnosis not present

## 2015-06-09 DIAGNOSIS — I959 Hypotension, unspecified: Secondary | ICD-10-CM | POA: Diagnosis not present

## 2015-06-09 DIAGNOSIS — E871 Hypo-osmolality and hyponatremia: Secondary | ICD-10-CM | POA: Diagnosis not present

## 2015-06-11 ENCOUNTER — Ambulatory Visit (HOSPITAL_COMMUNITY)
Admission: RE | Admit: 2015-06-11 | Discharge: 2015-06-11 | Disposition: A | Discharge status: Home / Self Care | Payer: Medicare Other | Source: Ambulatory Visit | Attending: Internal Medicine | Admitting: Internal Medicine

## 2015-06-11 ENCOUNTER — Other Ambulatory Visit (HOSPITAL_COMMUNITY): Payer: Self-pay | Admitting: Internal Medicine

## 2015-06-11 DIAGNOSIS — Z1231 Encounter for screening mammogram for malignant neoplasm of breast: Secondary | ICD-10-CM

## 2015-06-11 DIAGNOSIS — M25521 Pain in right elbow: Secondary | ICD-10-CM | POA: Insufficient documentation

## 2015-06-11 DIAGNOSIS — M7989 Other specified soft tissue disorders: Secondary | ICD-10-CM | POA: Diagnosis not present

## 2015-06-11 DIAGNOSIS — R600 Localized edema: Secondary | ICD-10-CM | POA: Diagnosis not present

## 2015-06-11 DIAGNOSIS — S59901A Unspecified injury of right elbow, initial encounter: Secondary | ICD-10-CM | POA: Diagnosis not present

## 2015-06-11 MED ORDER — GADOBENATE DIMEGLUMINE 529 MG/ML IV SOLN
20.0000 mL | Freq: Once | INTRAVENOUS | Status: AC | PRN
Start: 2015-06-11 — End: 2015-06-11
  Administered 2015-06-11: 18 mL via INTRAVENOUS

## 2015-06-13 NOTE — Clinical Social Work Note (Signed)
Clinical Social Work Assessment  Patient Details  Name: Jared Kim MRN: [id number] Date of Birth: Aug 19, 1927  Date of referral:  06/01/15               Reason for consult:  Facility Placement                Permission sought to share information with:  Facility Sport and exercise psychologist, Family Supports Permission granted to share information::  Yes, Verbal Permission Granted  Name::     Daughter [name]  [phone] , [place] and [place] SNF's  Agency::     Relationship::     Contact Information:     Housing/Transportation Living arrangements for the past 2 months:  Single Family Home (Lives with daughter and her husband) Source of Information:  Adult Children, Patient Patient Interpreter Needed:  None Criminal Activity/Legal Involvement Pertinent to Current Situation/Hospitalization:  No - Comment as needed Significant Relationships:  Adult Children Lives with:  Adult Children Do you feel safe going back to the place where you live?  Yes Need for family participation in patient care:  Yes (Comment)  Care giving concerns: Patient lives with daughter [name]- however he is alone much of the day and is now confused to time, place and situation. He is no longer safe to be alone.  Social Worker assessment / plan:  81 year old male referred to [facility] per PT recommendation for SNF level of care.  CSW met with patient and daughter [name] to discuss d/c options. Daughter agrees that SNF rehab is best course of action for patient. Discussed bed search process; daughter prefers [facility] or [facility] if possible.  Fl2 will be initiated and placed on chart for MD's signature. Patient is alert and oriented to person only at this time.  He is verbal and very pleasant.  Defers to his daughter for d/c plan.  Employment status:  Retired Forensic scientist:  [employer] PT Recommendations:  [place], Not assessed at this time Information / Referral to community  resources:  [place]  Patient/Family's Response to care:  Daughter agrees to [employer] and feels that she cannot manage her father's care at this time.  She worries about his level of confusion which has been noted at home at times resulting in her father wanting her around "all of the time."   Patient did not respond to this issue as he defers to his daughter.  Patient/Family's Understanding of and Emotional Response to Diagnosis, Current Treatment, and Prognosis: Daughter noted to be very understanding of patient's current medical condition and was relieved when CSW discussed PT's recommendation for SNF.  She feels that this is the best option for her father.  She is aware that based on his age and condition- as well as his deteriorating level of orientation- that she is not able to adequately provide for his care at this time.  Emotional Assessment Appearance:  Appears stated age Attitude/Demeanor/Rapport:   (quiet and sleepy; very little interaction with CSW- pleasant) Affect (typically observed):  Calm, Happy, Quiet, Appropriate, Pleasant Orientation:  Oriented to Self Alcohol / Substance use:  Never Used Psych involvement (Current and /or in the community):  No (Comment)  Discharge Needs  Concerns to be addressed:  Care Coordination Readmission within the last 30 days:  No Current discharge risk:  None Barriers to Discharge:  No Barriers Identified   Williemae Area, LCSW 06/01/2015  1:12 PM

## 2015-06-22 DIAGNOSIS — R509 Fever, unspecified: Secondary | ICD-10-CM | POA: Diagnosis not present

## 2015-06-22 DIAGNOSIS — R410 Disorientation, unspecified: Secondary | ICD-10-CM | POA: Diagnosis not present

## 2015-06-22 DIAGNOSIS — M2550 Pain in unspecified joint: Secondary | ICD-10-CM | POA: Diagnosis not present

## 2015-07-05 DIAGNOSIS — I82409 Acute embolism and thrombosis of unspecified deep veins of unspecified lower extremity: Secondary | ICD-10-CM | POA: Diagnosis not present

## 2015-07-05 DIAGNOSIS — L309 Dermatitis, unspecified: Secondary | ICD-10-CM | POA: Diagnosis not present

## 2015-07-05 DIAGNOSIS — R630 Anorexia: Secondary | ICD-10-CM | POA: Diagnosis not present

## 2015-07-05 DIAGNOSIS — I10 Essential (primary) hypertension: Secondary | ICD-10-CM | POA: Diagnosis not present

## 2015-07-09 DIAGNOSIS — D72829 Elevated white blood cell count, unspecified: Secondary | ICD-10-CM | POA: Diagnosis not present

## 2015-07-18 DIAGNOSIS — D649 Anemia, unspecified: Secondary | ICD-10-CM | POA: Diagnosis not present

## 2015-07-19 DIAGNOSIS — J209 Acute bronchitis, unspecified: Secondary | ICD-10-CM | POA: Diagnosis not present

## 2015-07-19 DIAGNOSIS — D72829 Elevated white blood cell count, unspecified: Secondary | ICD-10-CM | POA: Diagnosis not present

## 2015-08-02 DIAGNOSIS — D72829 Elevated white blood cell count, unspecified: Secondary | ICD-10-CM | POA: Diagnosis not present

## 2015-08-02 DIAGNOSIS — E41 Nutritional marasmus: Secondary | ICD-10-CM | POA: Diagnosis not present

## 2015-08-02 DIAGNOSIS — F322 Major depressive disorder, single episode, severe without psychotic features: Secondary | ICD-10-CM | POA: Diagnosis not present

## 2015-08-02 DIAGNOSIS — E871 Hypo-osmolality and hyponatremia: Secondary | ICD-10-CM | POA: Diagnosis not present

## 2015-08-03 DIAGNOSIS — D72829 Elevated white blood cell count, unspecified: Secondary | ICD-10-CM | POA: Diagnosis not present

## 2015-08-03 DIAGNOSIS — Z79899 Other long term (current) drug therapy: Secondary | ICD-10-CM | POA: Diagnosis not present

## 2015-08-03 DIAGNOSIS — E871 Hypo-osmolality and hyponatremia: Secondary | ICD-10-CM | POA: Diagnosis not present

## 2015-08-24 DIAGNOSIS — Z79899 Other long term (current) drug therapy: Secondary | ICD-10-CM | POA: Diagnosis not present

## 2015-08-24 DIAGNOSIS — E785 Hyperlipidemia, unspecified: Secondary | ICD-10-CM | POA: Diagnosis not present

## 2015-08-26 DIAGNOSIS — M79672 Pain in left foot: Secondary | ICD-10-CM | POA: Diagnosis not present

## 2015-08-26 DIAGNOSIS — M79671 Pain in right foot: Secondary | ICD-10-CM | POA: Diagnosis not present

## 2015-08-26 DIAGNOSIS — I2510 Atherosclerotic heart disease of native coronary artery without angina pectoris: Secondary | ICD-10-CM | POA: Diagnosis not present

## 2015-08-26 DIAGNOSIS — B351 Tinea unguium: Secondary | ICD-10-CM | POA: Diagnosis not present

## 2015-12-03 DIAGNOSIS — J811 Chronic pulmonary edema: Secondary | ICD-10-CM | POA: Diagnosis not present

## 2015-12-03 DIAGNOSIS — R05 Cough: Secondary | ICD-10-CM | POA: Diagnosis not present

## 2016-01-20 DIAGNOSIS — M79672 Pain in left foot: Secondary | ICD-10-CM | POA: Diagnosis not present

## 2016-01-20 DIAGNOSIS — B351 Tinea unguium: Secondary | ICD-10-CM | POA: Diagnosis not present

## 2016-01-20 DIAGNOSIS — I739 Peripheral vascular disease, unspecified: Secondary | ICD-10-CM | POA: Diagnosis not present

## 2016-01-20 DIAGNOSIS — M79671 Pain in right foot: Secondary | ICD-10-CM | POA: Diagnosis not present

## 2016-05-11 DIAGNOSIS — I739 Peripheral vascular disease, unspecified: Secondary | ICD-10-CM | POA: Diagnosis not present

## 2016-05-11 DIAGNOSIS — M79672 Pain in left foot: Secondary | ICD-10-CM | POA: Diagnosis not present

## 2016-05-11 DIAGNOSIS — B351 Tinea unguium: Secondary | ICD-10-CM | POA: Diagnosis not present

## 2016-05-11 DIAGNOSIS — M79671 Pain in right foot: Secondary | ICD-10-CM | POA: Diagnosis not present

## 2016-09-10 IMAGING — DX DG ELBOW COMPLETE 3+V*R*
4 series · 4 of 4 positions shown · non-contrast
Comparison: None.

CLINICAL DATA: Status post fall 10 days ago.  Right arm pain.

EXAM:
RIGHT ELBOW - COMPLETE 3+ VIEW

[elbow ap (1 of 2)]
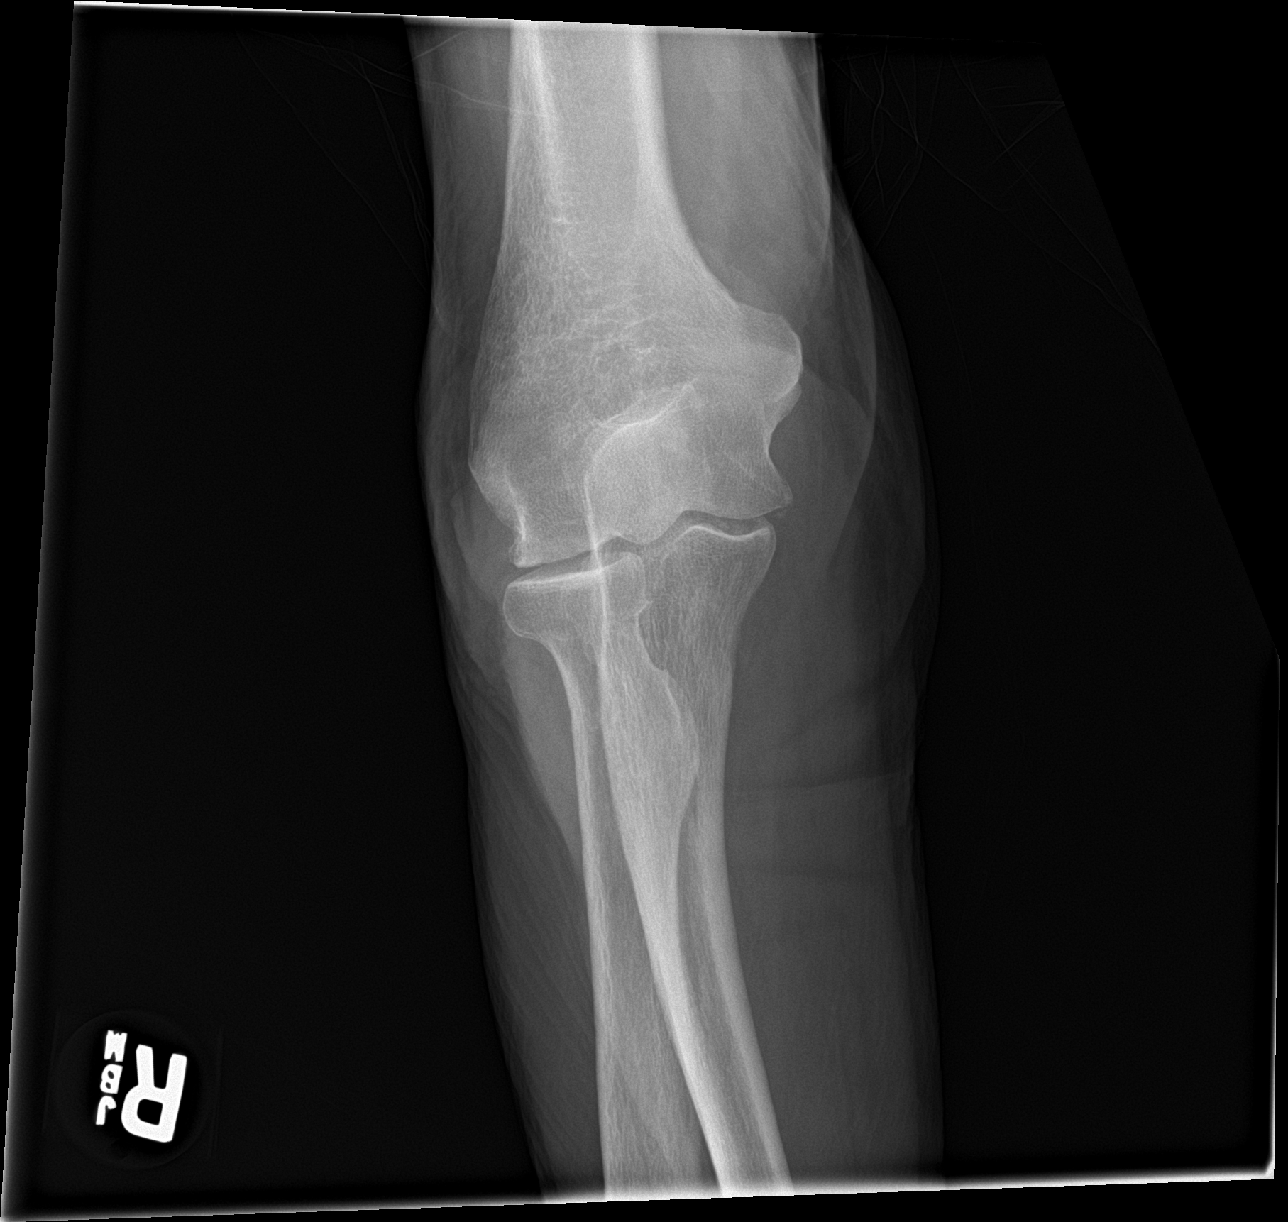

[elbow obl]
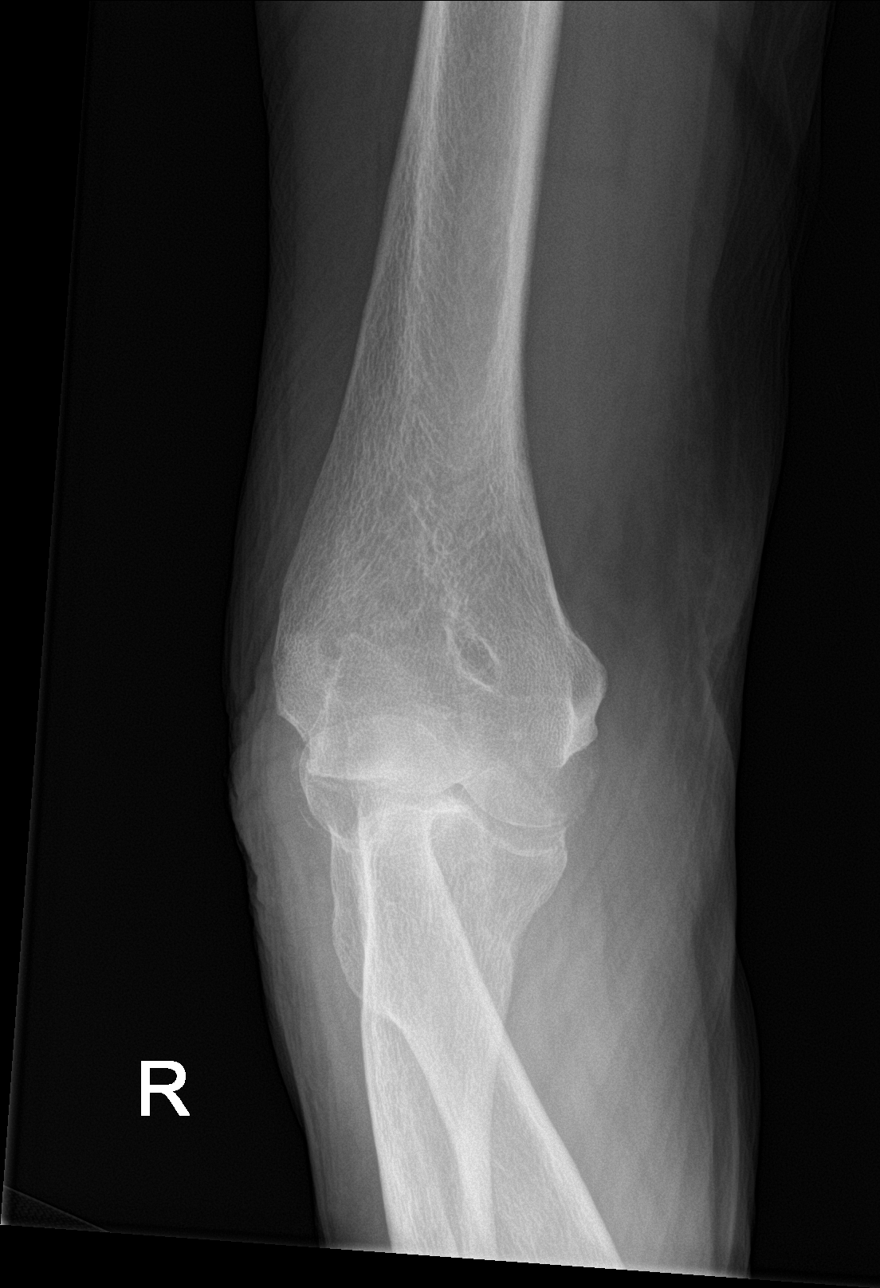

[elbow lat]
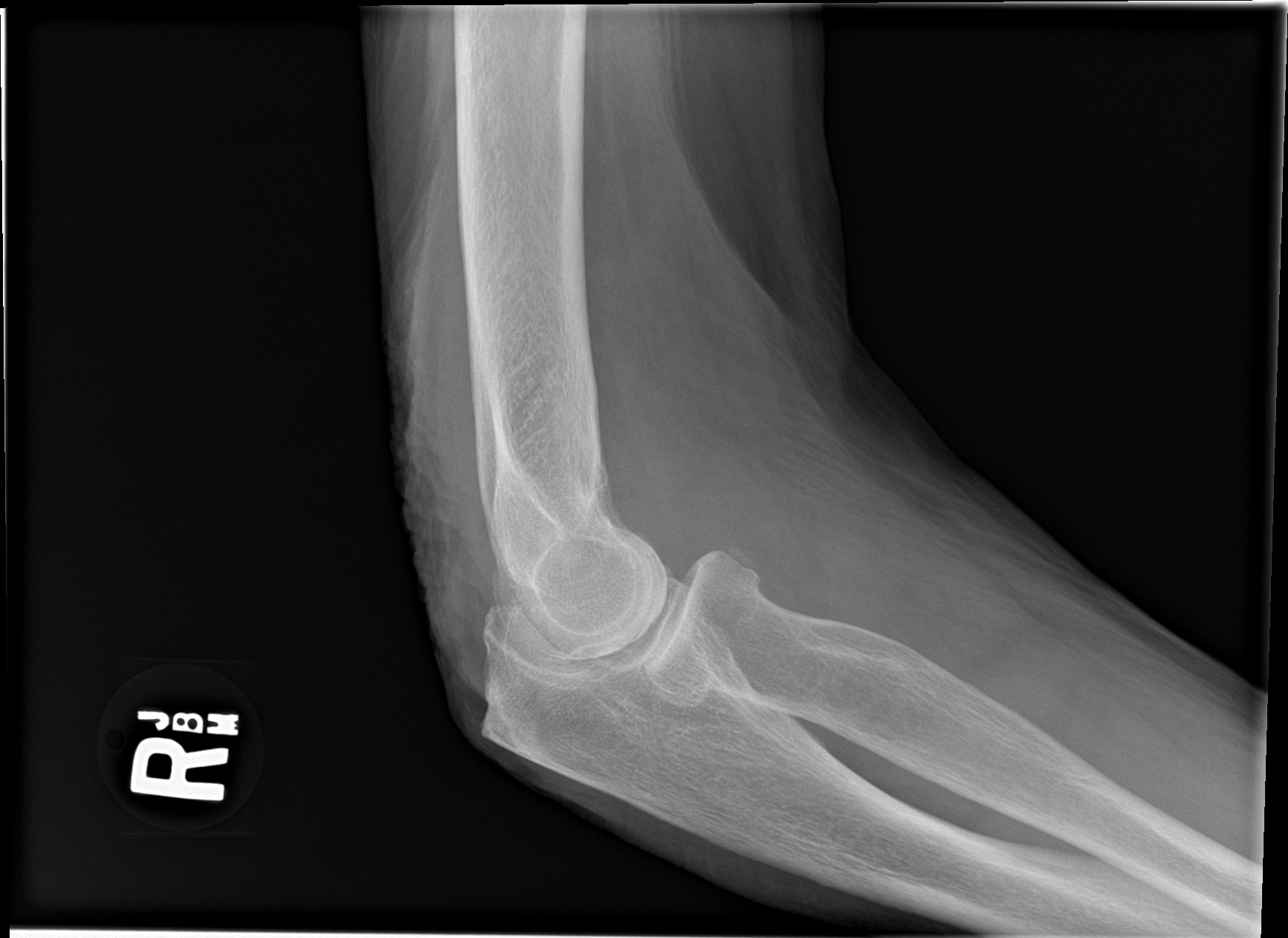

[elbow ap (2 of 2)]
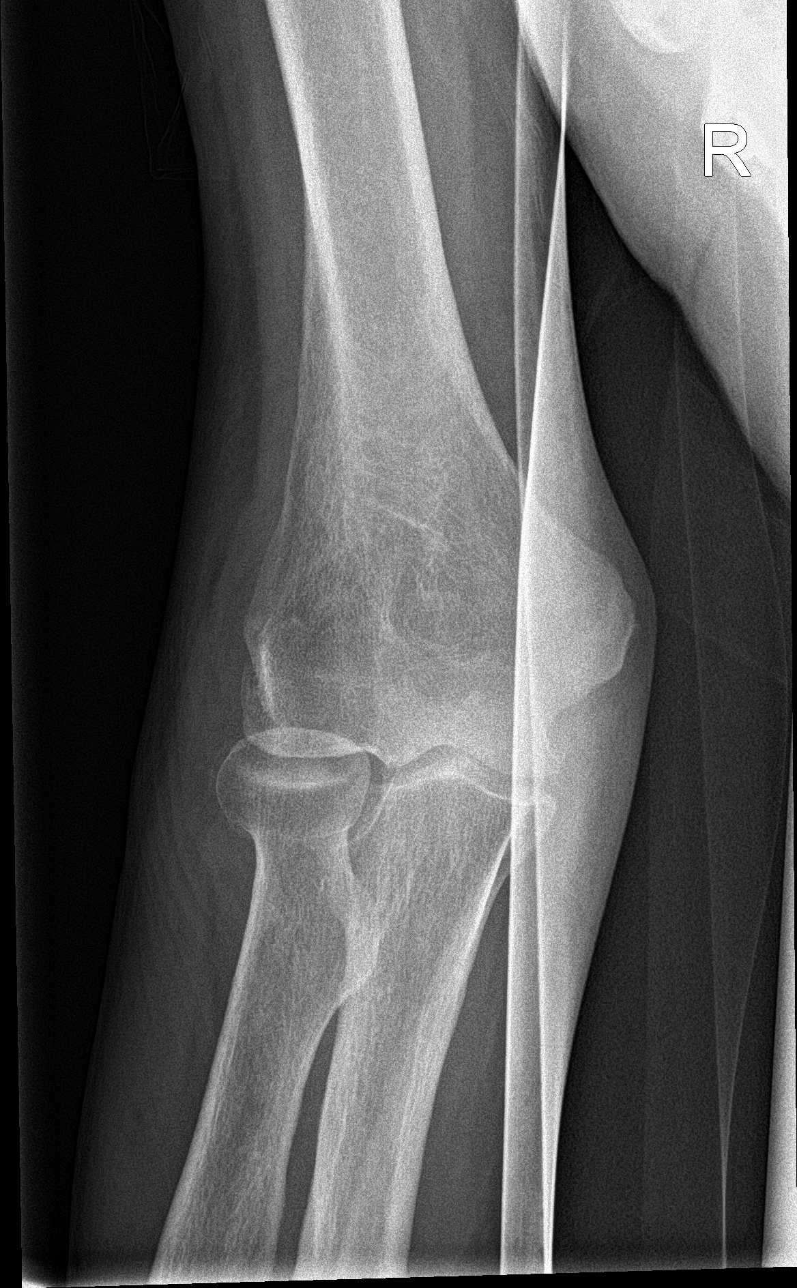

[4 of 4 positions shown; findings below may reference images not displayed]

FINDINGS: Small calcification along the right radial head. There is no
evidence of fracture, dislocation, or joint effusion. There is no
evidence of arthropathy or other focal bone abnormality. Soft
tissues are unremarkable.
IMPRESSION: 1. No acute fracture or dislocation of the right elbow.
2. Calcification along the right radial head which may be within the
soft tissues and are likely dystrophic. The appearance is atypical
for an avulsive injury.

## 2016-09-10 IMAGING — CT CT CERVICAL SPINE W/O CM
3 of 4 series · 12 of 33 positions shown, 14 images · non-contrast
Comparison: No priors.

CLINICAL DATA: 88-year-old male with history of trauma from a fall
10 days ago complaining of neck pain. Patient takes Xarelto for DVT.

EXAM:
CT CERVICAL SPINE WITHOUT CONTRAST
TECHNIQUE: Multidetector CT imaging of the cervical spine was performed without
intravenous contrast. Multiplanar CT image reconstructions were also
generated.

[Series 204: axial · axial · 0.32mm/px · z∈[+42,+169]mm · 4 of 104 slices shown, 5 images]
[im 18/104  soft-tissue]
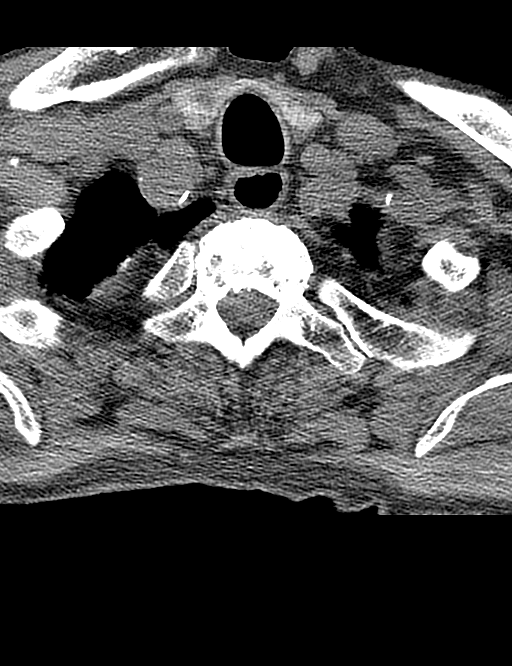
[im 18/104  bone]
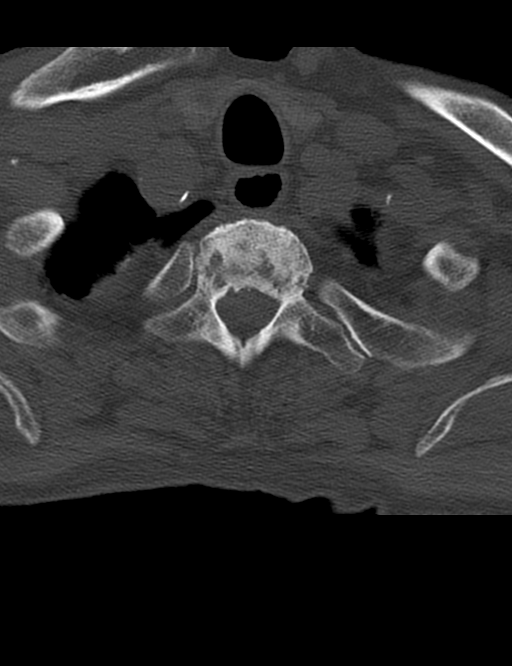
[im 35/104  bone]
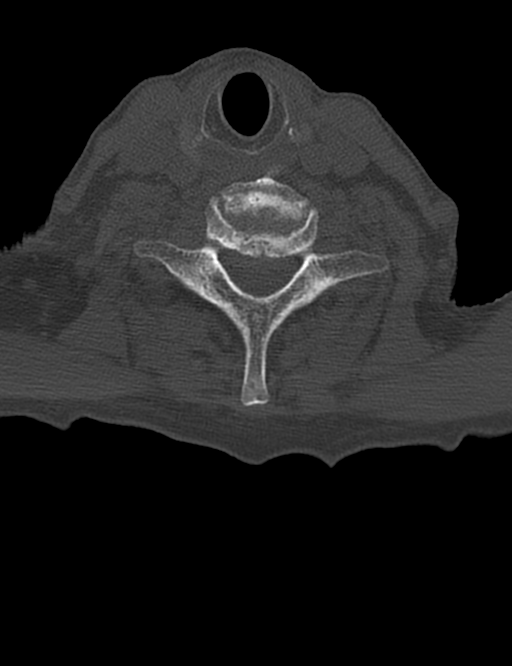
[im 69/104  bone]
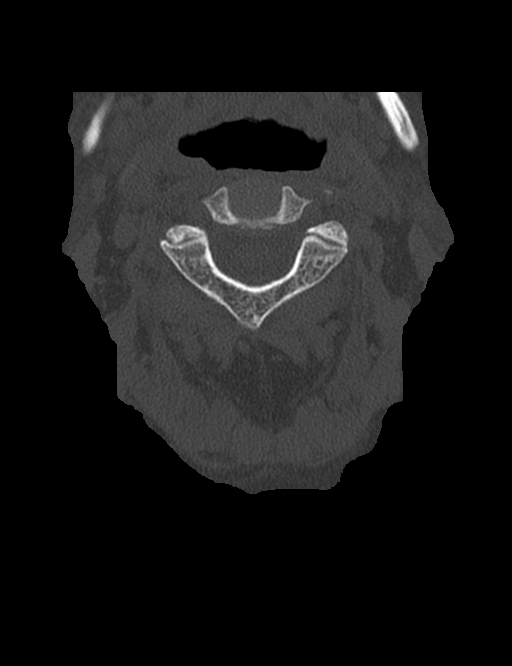
[im 86/104  bone]
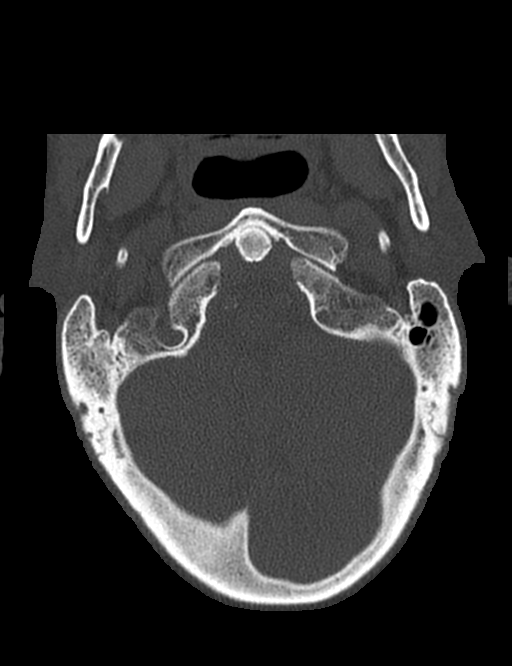

[Series 205: cor · coronal · 0.32mm/px · 3 of 48 slices shown]
[im 10/48  bone]
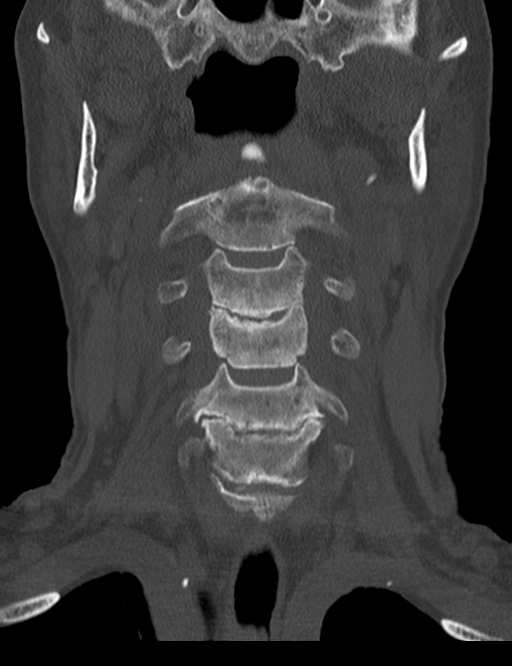
[im 19/48  bone]
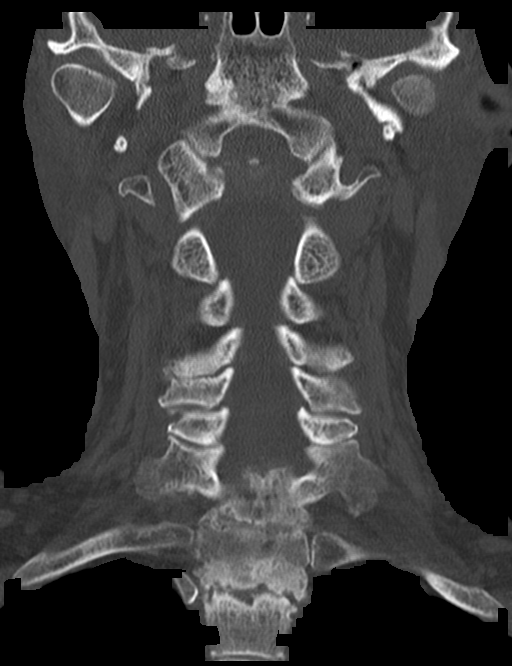
[im 29/48  bone]
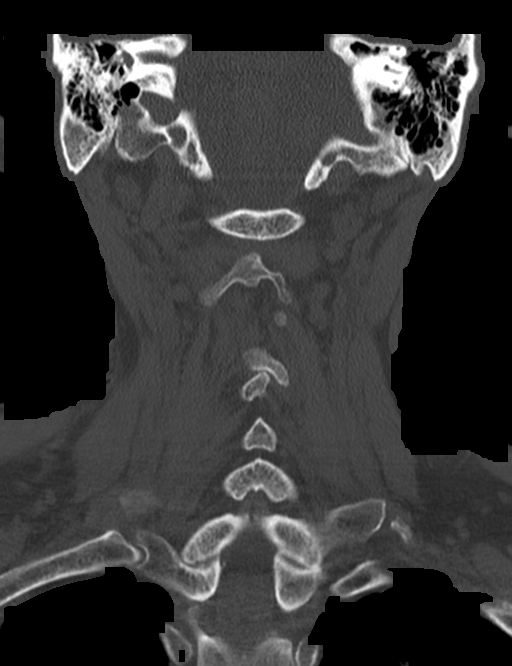

[Series 206: sag · sagittal · 0.32mm/px · 5 of 50 slices shown, 6 images]
[im 17/50  bone]
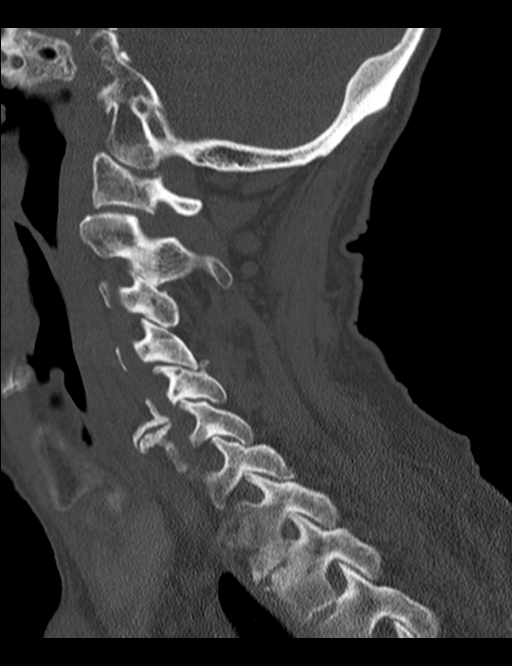
[im 21/50  bone]
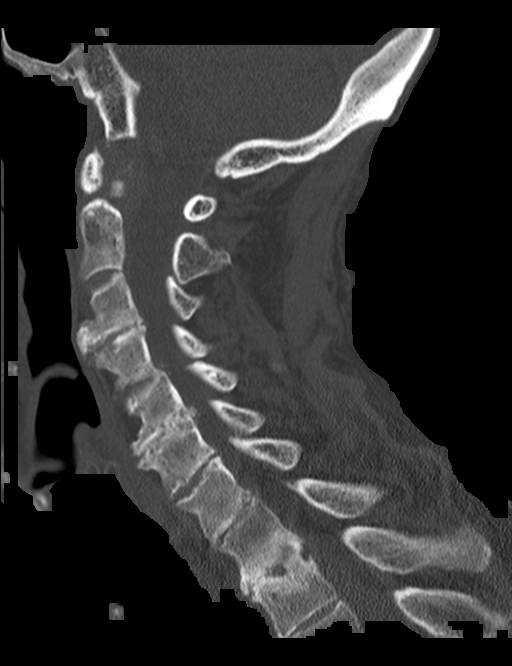
[im 25/50  soft-tissue]
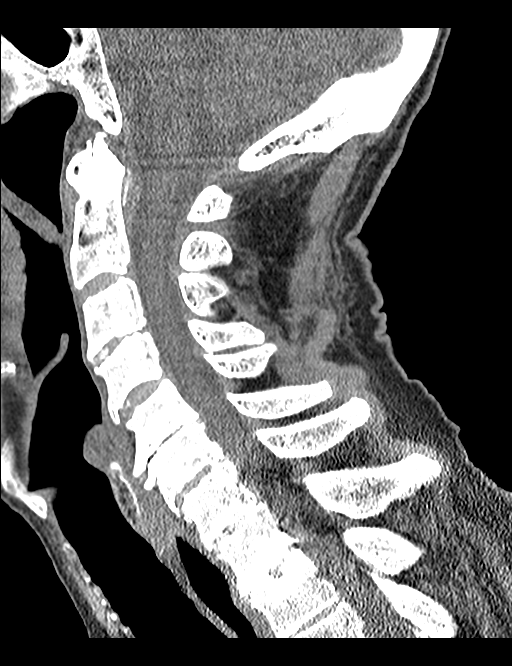
[im 25/50  bone]
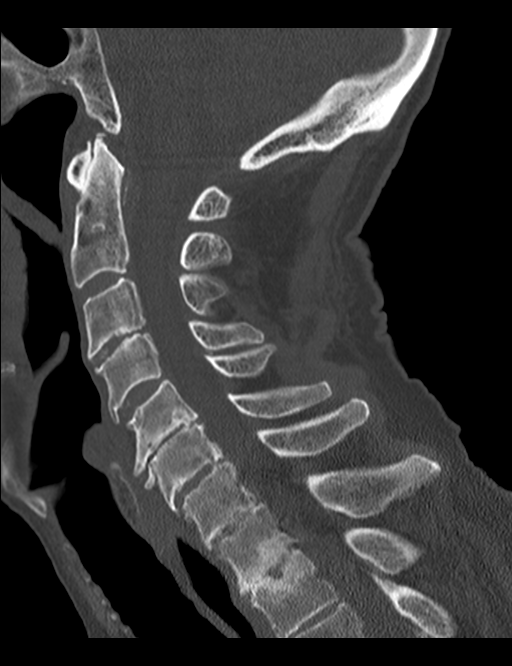
[im 29/50  bone]
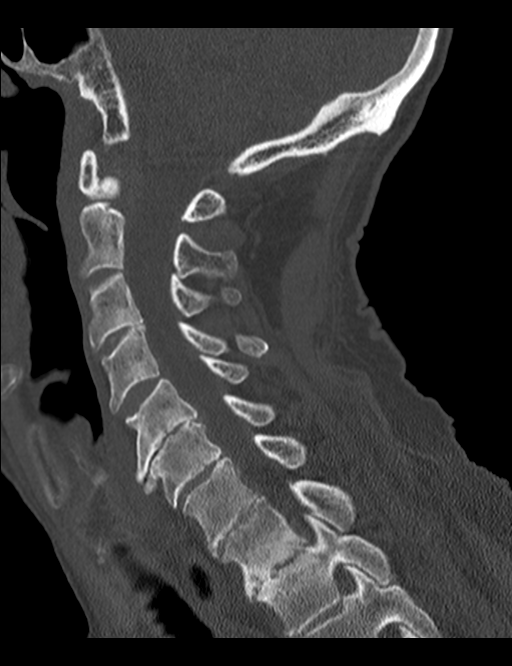
[im 33/50  bone]
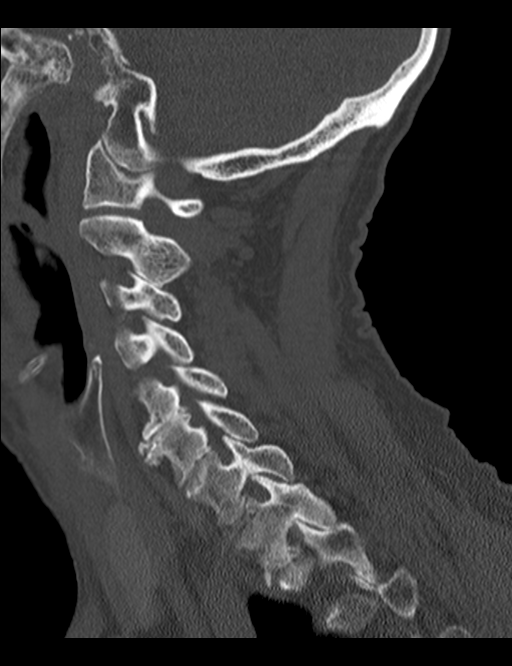

[12 of 33 positions shown; findings below may reference images not displayed]

FINDINGS: No acute displaced fracture of the cervical spine. Alignment is
anatomic. Prevertebral soft tissues are normal. Multilevel
degenerative disc disease, most severe at C5-C6 and T1-T2.
Multilevel facet arthropathy. Visualized portions of the upper
thorax are unremarkable.
IMPRESSION: 1. No signs of significant acute traumatic injury to the cervical
spine.
2. Multilevel degenerative disc disease and cervical spondylosis, as
above.

## 2018-12-16 DEATH — deceased
